# Patient Record
Sex: Female | Born: 1995 | Race: White | Hispanic: Yes | Marital: Married | Smoking: Never smoker
Health system: Southern US, Community
[De-identification: ages and names within clinical notes are randomized; demographics above are authoritative.]

## PROBLEM LIST (undated history)

## (undated) ENCOUNTER — Emergency Department (HOSPITAL_COMMUNITY): Admission: EM | Discharge status: Against Medical Advice | Payer: Self-pay

## (undated) DIAGNOSIS — N83202 Unspecified ovarian cyst, left side: Secondary | ICD-10-CM

## (undated) DIAGNOSIS — N83201 Unspecified ovarian cyst, right side: Secondary | ICD-10-CM

## (undated) DIAGNOSIS — R51 Headache: Secondary | ICD-10-CM

## (undated) DIAGNOSIS — R519 Headache, unspecified: Secondary | ICD-10-CM

## (undated) DIAGNOSIS — F909 Attention-deficit hyperactivity disorder, unspecified type: Secondary | ICD-10-CM

---
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
## 2014-04-19 ENCOUNTER — Emergency Department (INDEPENDENT_AMBULATORY_CARE_PROVIDER_SITE_OTHER): Payer: PRIVATE HEALTH INSURANCE

## 2014-04-19 ENCOUNTER — Encounter: Payer: Self-pay | Admitting: Emergency Medicine

## 2014-04-19 ENCOUNTER — Emergency Department
Admission: EM | Admit: 2014-04-19 | Discharge: 2014-04-19 | Disposition: A | Discharge status: Home / Self Care | Payer: PRIVATE HEALTH INSURANCE | Source: Home / Self Care | Attending: Emergency Medicine | Admitting: Emergency Medicine

## 2014-04-19 DIAGNOSIS — S9031XA Contusion of right foot, initial encounter: Secondary | ICD-10-CM

## 2014-04-19 DIAGNOSIS — M79609 Pain in unspecified limb: Secondary | ICD-10-CM

## 2014-04-19 DIAGNOSIS — S93429A Sprain of deltoid ligament of unspecified ankle, initial encounter: Secondary | ICD-10-CM

## 2014-04-19 DIAGNOSIS — S93629A Sprain of tarsometatarsal ligament of unspecified foot, initial encounter: Secondary | ICD-10-CM

## 2014-04-19 DIAGNOSIS — S9030XA Contusion of unspecified foot, initial encounter: Secondary | ICD-10-CM

## 2014-04-19 DIAGNOSIS — S93421A Sprain of deltoid ligament of right ankle, initial encounter: Secondary | ICD-10-CM

## 2014-04-19 DIAGNOSIS — M25579 Pain in unspecified ankle and joints of unspecified foot: Secondary | ICD-10-CM

## 2014-04-19 MED ORDER — IBUPROFEN 200 MG PO TABS: ORAL_TABLET | ORAL | Status: DC

## 2014-04-19 MED ORDER — HYDROCODONE-ACETAMINOPHEN 5-325 MG PO TABS: 1.0000 | ORAL_TABLET | ORAL | Status: DC | PRN

## 2014-04-19 NOTE — ED Provider Notes (Signed)
CSN: [id number]     Arrival date & time 04/19/14  1326 History   First MD Initiated Contact with Patient 04/19/14 1357     Chief Complaint  Patient presents with  . Foot Injury   Injured R foot/ankle jumping over a fence last evening. Pain on dorsal lateral side of foot.   Patient is a 18 y.o. female presenting with foot injury. The history is provided by the patient and a parent.  Foot Injury Lower extremity pain location: R lateral foot and ankle. Time since incident:  1 day Pain details:    Quality:  Sharp   Radiates to:  Does not radiate   Pain severity now: 7/10.   Onset quality:  Sudden Prior injury to area:  No Exacerbated by: Weightbearing. She cannot weight-bear on right foot and ankle. Ineffective treatments:  Rest Associated symptoms: decreased ROM, stiffness and swelling   Associated symptoms: no back pain, no fever, no muscle weakness, no neck pain, no numbness and no tingling     Past Medical History  Diagnosis Date  . ADHD (attention deficit hyperactivity disorder)    History reviewed. No pertinent past surgical history. Family History  Problem Relation Age of Onset  . Hypertension Mother   . Thyroid disease Mother   . Hyperlipidemia Father    History  Substance Use Topics  . Smoking status: Light Tobacco Smoker    Types: Cigarettes  . Smokeless tobacco: Never Used  . Alcohol Use: Yes     Comment: Occasionally   OB History   Grav Para Term Preterm Abortions TAB SAB Ect Mult Living                 Review of Systems  Constitutional: Negative for fever.  Musculoskeletal: Positive for stiffness. Negative for back pain and neck pain.  All other systems reviewed and are negative.   Allergies  Review of patient's allergies indicates no known allergies.  Home Medications   Prior to Admission medications   Medication Sig Start Date End Date Taking? Authorizing Provider  HYDROcodone-acetaminophen (NORCO/VICODIN) 5-325 MG per tablet Take 1-2 tablets by  mouth every 4 (four) hours as needed for severe pain. Take with food. 04/19/14   Lajean Manes, MD  ibuprofen (ADVIL,MOTRIN) 200 MG tablet Take three tablets ( 600 milligrams total) every 6 with food as needed for pain. 04/19/14   Lajean Manes, MD   BP 115/77  Pulse 84  Temp(Src) 98.1 F (36.7 C) (Oral)  Ht 5\' 5"  (1.651 m)  Wt 131 lb 8 oz (59.648 kg)  BMI 21.88 kg/m2  SpO2 100%  LMP 04/17/2014 Physical Exam  Nursing note and vitals reviewed. Constitutional: She is oriented to person, place, and time. She appears well-developed and well-nourished. No distress.  Pleasant female here with mother. No acute distress, but she is uncomfortable from right foot and ankle pain. When she tries to weight-bear, that exacerbate severe pain.  HENT:  Head: Normocephalic and atraumatic.  Eyes: Conjunctivae and EOM are normal. Pupils are equal, round, and reactive to light. No scleral icterus.  Neck: Normal range of motion.  Cardiovascular: Normal rate.   Pulmonary/Chest: Effort normal.  Abdominal: She exhibits no distension.  Musculoskeletal:       Right ankle: She exhibits decreased range of motion, swelling and ecchymosis. She exhibits no laceration and normal pulse. Tenderness. Lateral malleolus and AITFL tenderness found. No posterior TFL, no head of 5th metatarsal and no proximal fibula tenderness found. Achilles tendon normal.  Right foot: She exhibits decreased range of motion, bony tenderness and swelling. She exhibits normal capillary refill and no laceration.       Feet:  Neurological: She is alert and oriented to person, place, and time.  Skin: Skin is warm.  Psychiatric: She has a normal mood and affect.    ED Course  Procedures (including critical care time) Labs Review Labs Reviewed - No data to display  Imaging Review Dg Ankle Complete Right  04/19/2014   CLINICAL DATA:  Lateral lankle pain stab status post jumping injury.  EXAM: RIGHT ANKLE - COMPLETE 3+ VIEW  COMPARISON:   Right foot series of today's date.  FINDINGS: There is no evidence of fracture, dislocation, or joint effusion. The ankle joint mortise is preserved. There is no evidence of arthropathy or other focal bone abnormality. Soft tissues are unremarkable.  IMPRESSION: There is no acute bony abnormality of the right ankle   Electronically Signed   By: David  [place]   On: 04/19/2014 14:45   Dg Foot Complete Right  04/19/2014   CLINICAL DATA:  Lateral right foot pain and metatarsal region after jumping last evening  EXAM: RIGHT FOOT COMPLETE - 3+ VIEW  COMPARISON:  Right ankle series of today's date  FINDINGS: The bones of the foot are adequately mineralized. There is no acute fracture nor dislocation. Specific attention to the fourth and fifth metatarsals reveals no acute abnormality. The overlying soft tissues are normal.  IMPRESSION: There is no acute bony abnormality of the right foot.   Electronically Signed   By: David  [place]   On: 04/19/2014 14:41     MDM   1. Sprain of ankle, deltoid, right   2. Contusion of right foot   3. Sprain of tarsometatarsal ligament of foot    Reviewed above x-rays of right foot and right ankle: No acute bony abnormality. No fracture or dislocation. Discussed with patient and mother. Encourage rest, ice, compression with ACE bandage, and elevation of injured body part. Ace bandage applied right foot and ankle. Cam walker right foot applied. She declined crutches at this time For mild to moderate pain, ibuprofen OTC 600 mg 3 times a day with food as needed for pain. Also prescribed #12 of Vicodin 5/500. One or 2 every 4-6 hours as needed for severe pain, but precautions discussed. Note provided excusing from dancing or sports or weightbearing at work for one week. Followup with her orthopedist in one week, sooner if worse or new symptoms or red flags.  Precautions discussed. Red flags discussed. Questions invited and answered. Patient and mother voiced  understanding and agreement.     Lajean Manesavid Massey, MD 04/19/14 1517

## 2014-04-19 NOTE — ED Notes (Signed)
Injured R foot jumping over a fence last evening. Pain on dorsal lateral side of foot.

## 2014-07-06 ENCOUNTER — Encounter (HOSPITAL_COMMUNITY): Payer: Self-pay | Admitting: Emergency Medicine

## 2014-07-06 ENCOUNTER — Emergency Department (HOSPITAL_COMMUNITY)
Admission: EM | Admit: 2014-07-06 | Discharge: 2014-07-07 | Disposition: A | Discharge status: Home / Self Care | Payer: BC Managed Care – PPO | Attending: Emergency Medicine | Admitting: Emergency Medicine

## 2014-07-06 DIAGNOSIS — S4980XA Other specified injuries of shoulder and upper arm, unspecified arm, initial encounter: Secondary | ICD-10-CM | POA: Insufficient documentation

## 2014-07-06 DIAGNOSIS — S79929A Unspecified injury of unspecified thigh, initial encounter: Secondary | ICD-10-CM

## 2014-07-06 DIAGNOSIS — Z79899 Other long term (current) drug therapy: Secondary | ICD-10-CM | POA: Diagnosis not present

## 2014-07-06 DIAGNOSIS — Y9389 Activity, other specified: Secondary | ICD-10-CM | POA: Diagnosis not present

## 2014-07-06 DIAGNOSIS — S060X9A Concussion with loss of consciousness of unspecified duration, initial encounter: Secondary | ICD-10-CM | POA: Diagnosis not present

## 2014-07-06 DIAGNOSIS — F909 Attention-deficit hyperactivity disorder, unspecified type: Secondary | ICD-10-CM | POA: Insufficient documentation

## 2014-07-06 DIAGNOSIS — Z3202 Encounter for pregnancy test, result negative: Secondary | ICD-10-CM | POA: Insufficient documentation

## 2014-07-06 DIAGNOSIS — S79919A Unspecified injury of unspecified hip, initial encounter: Secondary | ICD-10-CM | POA: Diagnosis not present

## 2014-07-06 DIAGNOSIS — S298XXA Other specified injuries of thorax, initial encounter: Secondary | ICD-10-CM | POA: Diagnosis not present

## 2014-07-06 DIAGNOSIS — Y92410 Unspecified street and highway as the place of occurrence of the external cause: Secondary | ICD-10-CM | POA: Diagnosis not present

## 2014-07-06 DIAGNOSIS — S46909A Unspecified injury of unspecified muscle, fascia and tendon at shoulder and upper arm level, unspecified arm, initial encounter: Secondary | ICD-10-CM | POA: Diagnosis not present

## 2014-07-06 DIAGNOSIS — IMO0002 Reserved for concepts with insufficient information to code with codable children: Secondary | ICD-10-CM | POA: Diagnosis present

## 2014-07-06 DIAGNOSIS — F17200 Nicotine dependence, unspecified, uncomplicated: Secondary | ICD-10-CM | POA: Diagnosis not present

## 2014-07-06 MED ORDER — MORPHINE SULFATE 2 MG/ML IJ SOLN
2.0000 mg | Freq: Once | INTRAMUSCULAR | Status: AC
  Administered 2014-07-06: 2 mg via INTRAVENOUS
  Filled 2014-07-06: qty 1

## 2014-07-06 MED ORDER — ONDANSETRON HCL 4 MG/2ML IJ SOLN
4.0000 mg | Freq: Once | INTRAMUSCULAR | Status: AC
  Administered 2014-07-06: 4 mg via INTRAVENOUS
  Filled 2014-07-06: qty 2
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
## 2014-07-07 ENCOUNTER — Emergency Department (HOSPITAL_COMMUNITY): Payer: BC Managed Care – PPO

## 2014-07-07 DIAGNOSIS — IMO0002 Reserved for concepts with insufficient information to code with codable children: Secondary | ICD-10-CM | POA: Diagnosis not present

## 2014-07-07 LAB — POC URINE PREG, ED: PREG TEST UR: NEGATIVE

## 2014-07-07 MED ORDER — MORPHINE SULFATE 2 MG/ML IJ SOLN
2.0000 mg | Freq: Once | INTRAMUSCULAR | Status: AC
  Administered 2014-07-07: 2 mg via INTRAVENOUS
  Filled 2014-07-07: qty 1

## 2014-07-07 MED ORDER — IBUPROFEN 800 MG PO TABS
800.0000 mg | ORAL_TABLET | Freq: Once | ORAL | Status: AC
  Administered 2014-07-07: 800 mg via ORAL
  Filled 2014-07-07: qty 1

## 2014-07-07 MED ORDER — HYDROCODONE-ACETAMINOPHEN 5-325 MG PO TABS
1.0000 | ORAL_TABLET | Freq: Once | ORAL | Status: AC
  Administered 2014-07-07: 1 via ORAL
  Filled 2014-07-07: qty 1

## 2014-07-07 MED ORDER — ONDANSETRON HCL 4 MG/2ML IJ SOLN
4.0000 mg | Freq: Once | INTRAMUSCULAR | Status: AC
  Administered 2014-07-07: 4 mg via INTRAVENOUS
  Filled 2014-07-07: qty 2

## 2014-07-07 NOTE — ED Notes (Signed)
Pt states she is unable to urinate. Nurse will be informed.
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
## 2014-07-07 NOTE — ED Notes (Signed)
Patient reminded a urine sample is needed.  

## 2014-07-07 NOTE — ED Notes (Signed)
Notified CT of neg preg test.
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
## 2015-02-21 ENCOUNTER — Encounter: Payer: Self-pay | Admitting: Family Medicine

## 2015-02-21 ENCOUNTER — Ambulatory Visit (INDEPENDENT_AMBULATORY_CARE_PROVIDER_SITE_OTHER): Payer: PRIVATE HEALTH INSURANCE | Admitting: Family Medicine

## 2015-02-21 ENCOUNTER — Encounter (INDEPENDENT_AMBULATORY_CARE_PROVIDER_SITE_OTHER): Payer: Self-pay

## 2015-02-21 VITALS — BP 126/69 | HR 87 | Ht 65.0 in | Wt 155.0 lb

## 2015-02-21 DIAGNOSIS — M545 Low back pain, unspecified: Secondary | ICD-10-CM | POA: Insufficient documentation

## 2015-02-21 DIAGNOSIS — S39012A Strain of muscle, fascia and tendon of lower back, initial encounter: Secondary | ICD-10-CM

## 2015-02-21 MED ORDER — MELOXICAM 15 MG PO TABS: 15.0000 mg | ORAL_TABLET | Freq: Every day | ORAL | Status: DC

## 2015-02-21 NOTE — Patient Instructions (Addendum)
Your history and exam are consistent with a lumbar strain (less likely a disc herniation). Take meloxicam 15mg  daily with food for pain and inflammation. Heat 15 minutes at a time 3-4 times a day for spasms, pain. Start physical therapy. Do home exercises on days you don't go to therapy. Follow up with me in 5-6 weeks.

## 2015-02-21 NOTE — Assessment & Plan Note (Signed)
consistent with lumbar strain.  Radiographs including obliques negative.  Will go ahead with meloxicam, heat, physical therapy and home exercises.  If not improving over next 6 weeks consider MRI.
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
## 2015-03-05 ENCOUNTER — Encounter: Payer: Self-pay | Admitting: Physical Therapy

## 2015-03-05 ENCOUNTER — Ambulatory Visit: Payer: BLUE CROSS/BLUE SHIELD | Attending: Family Medicine | Admitting: Physical Therapy

## 2015-03-05 DIAGNOSIS — M545 Low back pain, unspecified: Secondary | ICD-10-CM
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
## 2015-03-12 ENCOUNTER — Ambulatory Visit: Payer: BLUE CROSS/BLUE SHIELD | Attending: Family Medicine | Admitting: Physical Therapy

## 2015-03-12 DIAGNOSIS — M545 Low back pain, unspecified: Secondary | ICD-10-CM

## 2015-03-12 NOTE — Therapy (Signed)
[facility] [address]  [address] [place], [place], [address] Phone: [phone]   Fax:  [phone]  Physical Therapy Treatment  Patient Details  Name: Kara Cole MRN: [id number] Date of Birth: 1996/10/01 Referring Provider:  Lenda KelpHudnall, Shane R, MD  Encounter Date: 03/12/2015      PT End of Session - 03/12/15 1110    Visit Number 2   Number of Visits 12   Date for PT Re-Evaluation 04/16/15   PT Start Time 1107   PT Stop Time 1155   PT Time Calculation (min) 48 min      Past Medical History  Diagnosis Date  . ADHD (attention deficit hyperactivity disorder)     No past surgical history on file.  There were no vitals filed for this visit.  Visit Diagnosis:  Left-sided low back pain without sciatica      Subjective Assessment - 03/12/15 1108    Subjective States worked a lot last week requiring great deal of standing and noted increased pain from this. Did not work yesterday so pain down to 2-3/10 today.    Currently in Pain? Yes   Pain Score 3    Pain Location Back   Pain Orientation Left;Lower       TODAY'S TREATMENT: TherEx - NuStep lvl 5, 3' Bridge 10x Partial Curl-up 15x LTR 1' Side-Lying trunk rotation stretch 20" each Leg Press 25# 2x15 Side-Lying over 55cm p-ball trunk side-bending 10x each (easy with R SB, difficult and pain with L SB) Mod Thomas Stretch with R SKTC due to R anterior torsion vs L   Manual - contract/relax L QL in R side-lying; TPR L QL in R side-lying 3 strip kinesiotape to l-spine   HEP instruction          PT Education - 03/12/15 1157    Education provided Yes   Education Details HEP Update   Person(s) Educated Patient   Methods Explanation;Demonstration;Handout   Comprehension Verbalized understanding;Returned demonstration          PT Short Term Goals - 03/12/15 1159    PT SHORT TERM GOAL #1   Title independent with initial HEP by 03/12/15   Status  Achieved           PT Long Term Goals - 03/12/15 1159    PT LONG TERM GOAL #1   Title pt denies l-spine pain greater than 2/10 by 04/16/15   Status On-going   PT LONG TERM GOAL #2   Title pt displays good squat and floor level lifting mechanics without LBP by 04/16/15   Status On-going   PT LONG TERM GOAL #3   Title pt able to perform all work duties without restriction by LBP by 04/16/15   Status On-going               Plan - 03/12/15 1159    Clinical Impression Statement pt with continued L LBP, pelvic assymetry noted with R innominant anterior/inferior vs L.  Added pelvic stability to HEP.  Pt hypersensitive throughout L pelvis so does not tolerate manual well at this time.   PT Next Visit Plan assess and treat pelvic assymetry each visit, lumbopelvic stability progressions as able, TRX next visit?   Consulted and Agree with Plan of Care Patient        Problem List Patient Active Problem List   Diagnosis Date Noted  . Low back pain 02/21/2015    Debborah Alonge PT, OCS 03/12/2015, 12:02 PM  [facility] [address]  [place], [place], [address] Phone: [phone]   Fax:  [phone]

## 2015-03-15 ENCOUNTER — Ambulatory Visit: Payer: BLUE CROSS/BLUE SHIELD | Admitting: Rehabilitation

## 2015-03-19 ENCOUNTER — Ambulatory Visit: Payer: BLUE CROSS/BLUE SHIELD | Admitting: Physical Therapy

## 2015-03-19 DIAGNOSIS — M545 Low back pain, unspecified: Secondary | ICD-10-CM

## 2015-03-19 NOTE — Therapy (Signed)
[facility] [address]  [address] [place], [place], [address] Phone: [phone]   Fax:  [phone]  Physical Therapy Treatment  Patient Details  Name: Kara Cole MRN: [id number] Date of Birth: 14-Jun-1996 Referring Provider:  Lenda KelpHudnall, Shane R, MD  Encounter Date: 03/19/2015      PT End of Session - 03/19/15 1026    Visit Number 3   Number of Visits 12   Date for PT Re-Evaluation 04/16/15   PT Start Time 1023   PT Stop Time 1108   PT Time Calculation (min) 45 min      Past Medical History  Diagnosis Date  . ADHD (attention deficit hyperactivity disorder)     No past surgical history on file.  There were no vitals filed for this visit.  Visit Diagnosis:  Left-sided low back pain without sciatica      Subjective Assessment - 03/19/15 1025    Subjective Worked 60 hours last week so fatigued and sore today.   Currently in Pain? Yes   Pain Score 5    Pain Location Back   Pain Orientation Left;Lower        TODAY'S TREATMENT Premod e-stim (10-20Hz ) with MHP to B l-spine pt prone x 15' to assist with tissue relaxation and pain reduction prior to exercise/stretching (states feels "relaxed" after this)  TherEx - Elliptical lvl 2.5, 3' LTR 1' Bridge 15x Stretch B HS, Piri, and SKTC Quadruped UE/LE 10x each Bridge 15x  3-strip kinesiotape to l-spine      PT Short Term Goals - 03/12/15 1159    PT SHORT TERM GOAL #1   Title independent with initial HEP by 03/12/15   Status Achieved           PT Long Term Goals - 03/12/15 1159    PT LONG TERM GOAL #1   Title pt denies l-spine pain greater than 2/10 by 04/16/15   Status On-going   PT LONG TERM GOAL #2   Title pt displays good squat and floor level lifting mechanics without LBP by 04/16/15   Status On-going   PT LONG TERM GOAL #3   Title pt able to perform all work duties without restriction by LBP by 04/16/15   Status On-going                Plan - 03/19/15 1100    Clinical Impression Statement focus on decreasing pain/guarding today since she is working so many hours lately.  Benefit with e-stim/MHP today.  Less TTP to L pelvis today.  Pelvis appears level.   PT Next Visit Plan assess and treat pelvic assymetry each visit, lumbopelvic stability progressions as able, TRX next visit?   Consulted and Agree with Plan of Care Patient        Problem List Patient Active Problem List   Diagnosis Date Noted  . Low back pain 02/21/2015    Bayleigh Loflin PT, OCS 03/19/2015, 11:49 AM  [facility] [address]  [address] [place], [place], [address] Phone: [phone]   Fax:  [phone]

## 2015-03-21 ENCOUNTER — Ambulatory Visit: Payer: BLUE CROSS/BLUE SHIELD | Admitting: Rehabilitation

## 2015-03-21 DIAGNOSIS — M545 Low back pain, unspecified: Secondary | ICD-10-CM

## 2015-03-21 NOTE — Therapy (Signed)
[facility] [place] [address]  [address] [place], [place], [address] Phone: [phone]   Fax:  [phone]  Physical Therapy Treatment  Patient Details  Name: Kara Cole MRN: [id number] Date of Birth: 23-Jan-1996 Referring Provider:  Lenda KelpHudnall, Shane R, MD  Encounter Date: 03/21/2015      PT End of Session - 03/21/15 1107    Visit Number 4   Number of Visits 12   Date for PT Re-Evaluation 04/16/15   PT Start Time 1107   PT Stop Time 1146   PT Time Calculation (min) 39 min      Past Medical History  Diagnosis Date  . ADHD (attention deficit hyperactivity disorder)     No past surgical history on file.  There were no vitals filed for this visit.  Visit Diagnosis:  Left-sided low back pain without sciatica      Subjective Assessment - 03/21/15 1108    Subjective Feels pretty good today but has to go into work later.    Currently in Pain? Yes   Pain Score 2    Pain Location Back   Pain Orientation Left;Lower      TODAY'S TREATMENT TherEx - Elliptical lvl 2.5, 4' Stretch B HS, Piri, and SKTC Bridge 15x LTR 1' Partial Curl-up 15x  Quadruped UE/LE 10x each Childs pose M/R/L 2x20" each Side-Lying trunk rotation stretch 2x20"  3-strip kinesiotape to l-spine         PT Short Term Goals - 03/12/15 1159    PT SHORT TERM GOAL #1   Title independent with initial HEP by 03/12/15   Status Achieved           PT Long Term Goals - 03/12/15 1159    PT LONG TERM GOAL #1   Title pt denies l-spine pain greater than 2/10 by 04/16/15   Status On-going   PT LONG TERM GOAL #2   Title pt displays good squat and floor level lifting mechanics without LBP by 04/16/15   Status On-going   PT LONG TERM GOAL #3   Title pt able to perform all work duties without restriction by LBP by 04/16/15   Status On-going               Plan - 03/21/15 1146    Clinical Impression Statement Good tolerance with exercises today,  pain noted with curl-ups and quadruped LE/UE lift but all others fine. alignments appears to be correct again today. Pt declined MH/e-stim today due to feeling better.    PT Next Visit Plan Check pelvic alignment, lumbopelivc stability and include TRX if able, modalities as needed.   Consulted and Agree with Plan of Care Patient        Problem List Patient Active Problem List   Diagnosis Date Noted  . Low back pain 02/21/2015    Ronney LionDUCKER, Elden Brucato J, PTA 03/21/2015, 11:50 AM  [facility] [place] [address]  [address] [place], [place], [address] Phone: [phone]   Fax:  [phone]

## 2015-03-26 ENCOUNTER — Ambulatory Visit: Payer: BLUE CROSS/BLUE SHIELD | Admitting: Physical Therapy

## 2015-03-28 ENCOUNTER — Ambulatory Visit: Payer: BLUE CROSS/BLUE SHIELD | Admitting: Rehabilitation

## 2015-04-02 ENCOUNTER — Ambulatory Visit: Payer: BLUE CROSS/BLUE SHIELD | Admitting: Physical Therapy

## 2015-04-02 DIAGNOSIS — M545 Low back pain, unspecified: Secondary | ICD-10-CM

## 2015-04-02 NOTE — Therapy (Signed)
[facility] [place] [address]  [address] [place], [place], [address] Phone: [phone]   Fax:  [phone]  Physical Therapy Treatment  Patient Details  Name: Kara Cole MRN: [id number] Date of Birth: 1996-01-04 Referring Provider:  Lenda KelpHudnall, Shane R, MD  Encounter Date: 04/02/2015      PT End of Session - 04/02/15 1026    Visit Number 5   Number of Visits 12   Date for PT Re-Evaluation 04/16/15   PT Start Time 1021   PT Stop Time 1104   PT Time Calculation (min) 43 min      Past Medical History  Diagnosis Date  . ADHD (attention deficit hyperactivity disorder)     No past surgical history on file.  There were no vitals filed for this visit.  Visit Diagnosis:  Left-sided low back pain without sciatica      Subjective Assessment - 04/02/15 1022    Subjective States has been working great deal lately (60 hrs/wk) and has not been doing HEP much.  States notes LBP up to 7-8/10 while at work and 5-6/10 on average.  Denies N/T.  Reports noting few minutes of sharp pain in L Lower back with FW or OH reaching motions at work.  Pt states that overall she feels as though she is improving.   Currently in Pain? No/denies   Pain Score 3   pain up to 7/10 at worst while at work   Pain Location Back   Pain Orientation Left;Lower            [facility] - 04/02/15 0001    Strength   Right/Left Hip Right;Left   Right Hip Extension 5/5   Left Hip Extension 4+/5  L LBP   Left Hip ABduction 4+/5  L LBP   Palpation   Palpation comment continued TTP L lower lumbar paraspinals and quadratus lumborum   Prone Knee Bend Test   Findings Negative   Straight Leg Raise   Findings Negative   other   Findings Positive   Comments L SI Compression in R Side-Lying   Luisa HartPatrick (FABER) Test   Findings Positive   Side Left   Comments L LBP   Thomas Test    Findings Positive   Side Left   Comments L LBP   Special Tests   Hip Special Tests  Luisa HartPatrick (FABER) Test;Thomas Test          TODAY'S TREATMENT TherEx - Elliptical lvl 2.5, 3' Bridge 15x SKTC Mod Thomas Stretch 2x20" each Quadruped UE/LE 10x then 10x each with Blue TB with R LE and L UE but unable to perform on L LE due to pain Quadruped Dirty Dog 10x each (mild L pain with R raise) 3-way Prayer stretch 2x20" each TRX BW Lunges 3 strips kinesiotape to l-spine        PT Short Term Goals - 03/12/15 1159    PT SHORT TERM GOAL #1   Title independent with initial HEP by 03/12/15   Status Achieved           PT Long Term Goals - 03/12/15 1159    PT LONG TERM GOAL #1   Title pt denies l-spine pain greater than 2/10 by 04/16/15   Status On-going   PT LONG TERM GOAL #2   Title pt displays good squat and floor level lifting mechanics without LBP by 04/16/15   Status On-going   PT LONG TERM GOAL #3   Title  pt able to perform all work duties without restriction by LBP by 04/16/15   Status On-going               Plan - 04/02/15 1050    Clinical Impression Statement s/s seem to indicate L SI vs L lower lumbar pain related to limited stability.  Pt with limited performance of HEP lately due to working so much.  Advised her to try to increase performance and added quadruped UE/LE.   PT Next Visit Plan Check pelvic alignment, lumbopelivc stability and include TRX if able, modalities as needed.   Consulted and Agree with Plan of Care Patient        Problem List Patient Active Problem List   Diagnosis Date Noted  . Low back pain 02/21/2015    Daizy Outen PT, OCS 04/02/2015, 11:11 AM  [facility] [address]  [address] [place], [place], [address] Phone: [phone]   Fax:  [phone]

## 2015-04-04 ENCOUNTER — Ambulatory Visit: Payer: BLUE CROSS/BLUE SHIELD | Admitting: Rehabilitation

## 2015-04-09 ENCOUNTER — Ambulatory Visit: Payer: BLUE CROSS/BLUE SHIELD | Admitting: Rehabilitation

## 2015-04-09 DIAGNOSIS — M545 Low back pain, unspecified: Secondary | ICD-10-CM

## 2015-04-09 NOTE — Therapy (Addendum)
[facility] [place] [address]  [place], [place], [address] Phone: [phone]   Fax:  [phone]  Physical Therapy Treatment  Patient Details  Name: [place] MRN: [id number] Date of Birth: April 16, 1996 Referring Provider:  Dene Gentry, MD  Encounter Date: 04/09/2015      PT End of Session - 04/09/15 1109    Visit Number 6   Number of Visits 12   Date for PT Re-Evaluation 04/16/15   PT Start Time 1108   PT Stop Time 1148   PT Time Calculation (min) 40 min      Past Medical History  Diagnosis Date  . ADHD (attention deficit hyperactivity disorder)     No past surgical history on file.  There were no vitals filed for this visit.  Visit Diagnosis:  Left-sided low back pain without sciatica      Subjective Assessment - 04/09/15 1109    Subjective Reports this past week has been bad for back pain. Unsure why and states that its a lot worse than normal. States her back pain has been averaging a 5-6/10 while at work but reaches a 8/10 at the end of the night. Reports she fell at work a few days ago which could have contributed it. Also reports compliance with HEP.    Currently in Pain? Yes   Pain Score 5    Pain Location Back   Pain Orientation --  Reports whole back hurts but Lt is worse.       TODAY'S TREATMENT TherEx - Elliptical lvl 1.0 x2' (pain) Stretch Bil Hamstring, Piriformis, SKTC 2x20" bilateral Bridge 15x Childs pose M/R/L 2x20" Premod e-stim (10-_0 ) with MHP to Bil L-spine x15' with pt prone to help decrease pain and relax tissues.  3-strip Kinesiotape to L-spine         PT Short Term Goals - 03/12/15 1159    PT SHORT TERM GOAL #1   Title independent with initial HEP by 03/12/15   Status Achieved           PT Long Term Goals - 03/12/15 1159    PT LONG TERM GOAL #1   Title pt denies l-spine pain greater than 2/10 by 04/16/15   Status On-going   PT LONG TERM GOAL #2   Title pt displays good squat and floor level lifting mechanics without LBP by 04/16/15   Status On-going   PT LONG TERM GOAL #3   Title pt able to perform all work duties without restriction by LBP by 04/16/15   Status On-going               Plan - 04/09/15 1132    Clinical Impression Statement Lower intensity treatment today due to high pain levels and pt unable to tolerate much. Pt had a very stiff posture during transfers and gait. Will return to exercise as able.    PT Next Visit Plan Check pelvic alignment, lumbopelivc stability and include TRX if able, modalities as needed.   Consulted and Agree with Plan of Care Patient        Problem List Patient Active Problem List   Diagnosis Date Noted  . Low back pain 02/21/2015    Barbette Hair, PTA 04/09/2015, 11:52 AM  [facility] [address]  [place], [place], [address] Phone: [phone]   Fax:  [phone]     PHYSICAL THERAPY DISCHARGE SUMMARY  Visits from Start of  Care: 6  Current functional level related to goals / functional outcomes: See above; pt did not return to PT therefore unknown if goals met.  Pt with inconsistent attendance to PT and did not show for last appt or opt to reschedule.   Remaining deficits: unknown   Education / Equipment: HEP  Plan: Patient agrees to discharge.  Patient goals were not met. Patient is being discharged due to not returning since the last visit.  ?????         Laureen Abrahams, PT, DPT 05/23/2015 10:02 AM  [facility] [address] [place], [place] [address]  [phone] (office) [phone] (fax)

## 2015-04-11 ENCOUNTER — Ambulatory Visit: Payer: BLUE CROSS/BLUE SHIELD | Attending: Family Medicine | Admitting: Rehabilitation

## 2015-05-10 ENCOUNTER — Other Ambulatory Visit: Payer: Self-pay | Admitting: *Deleted

## 2015-05-10 ENCOUNTER — Other Ambulatory Visit: Payer: Self-pay | Admitting: Family Medicine

## 2015-05-10 MED ORDER — MELOXICAM 15 MG PO TABS: 15.0000 mg | ORAL_TABLET | Freq: Every day | ORAL | Status: DC

## 2016-06-27 ENCOUNTER — Emergency Department (HOSPITAL_COMMUNITY): Payer: BLUE CROSS/BLUE SHIELD

## 2016-06-27 ENCOUNTER — Emergency Department (HOSPITAL_COMMUNITY)
Admission: EM | Admit: 2016-06-27 | Discharge: 2016-06-27 | Disposition: A | Discharge status: Home / Self Care | Payer: BLUE CROSS/BLUE SHIELD | Attending: Emergency Medicine | Admitting: Emergency Medicine

## 2016-06-27 ENCOUNTER — Encounter (HOSPITAL_COMMUNITY): Payer: Self-pay | Admitting: *Deleted

## 2016-06-27 DIAGNOSIS — R109 Unspecified abdominal pain: Secondary | ICD-10-CM | POA: Diagnosis not present

## 2016-06-27 DIAGNOSIS — R079 Chest pain, unspecified: Secondary | ICD-10-CM | POA: Diagnosis not present

## 2016-06-27 DIAGNOSIS — Y999 Unspecified external cause status: Secondary | ICD-10-CM | POA: Diagnosis not present

## 2016-06-27 DIAGNOSIS — S0990XA Unspecified injury of head, initial encounter: Secondary | ICD-10-CM | POA: Insufficient documentation

## 2016-06-27 DIAGNOSIS — S161XXA Strain of muscle, fascia and tendon at neck level, initial encounter: Secondary | ICD-10-CM | POA: Diagnosis not present

## 2016-06-27 DIAGNOSIS — S20219A Contusion of unspecified front wall of thorax, initial encounter: Secondary | ICD-10-CM | POA: Insufficient documentation

## 2016-06-27 DIAGNOSIS — Z79899 Other long term (current) drug therapy: Secondary | ICD-10-CM | POA: Insufficient documentation

## 2016-06-27 DIAGNOSIS — S301XXA Contusion of abdominal wall, initial encounter: Secondary | ICD-10-CM

## 2016-06-27 DIAGNOSIS — Y92410 Unspecified street and highway as the place of occurrence of the external cause: Secondary | ICD-10-CM | POA: Insufficient documentation

## 2016-06-27 DIAGNOSIS — S199XXA Unspecified injury of neck, initial encounter: Secondary | ICD-10-CM | POA: Diagnosis present

## 2016-06-27 DIAGNOSIS — S20212A Contusion of left front wall of thorax, initial encounter: Secondary | ICD-10-CM

## 2016-06-27 DIAGNOSIS — S8002XA Contusion of left knee, initial encounter: Secondary | ICD-10-CM | POA: Diagnosis not present

## 2016-06-27 DIAGNOSIS — Y939 Activity, unspecified: Secondary | ICD-10-CM | POA: Insufficient documentation

## 2016-06-27 LAB — URINALYSIS, ROUTINE W REFLEX MICROSCOPIC
BILIRUBIN URINE: NEGATIVE
GLUCOSE, UA: NEGATIVE mg/dL
KETONES UR: NEGATIVE mg/dL
Nitrite: NEGATIVE
PROTEIN: NEGATIVE mg/dL
Specific Gravity, Urine: 1.009 (ref 1.005–1.030)
pH: 6.5 (ref 5.0–8.0)

## 2016-06-27 LAB — RAPID URINE DRUG SCREEN, HOSP PERFORMED
Amphetamines: NOT DETECTED
BARBITURATES: NOT DETECTED
Benzodiazepines: NOT DETECTED
Cocaine: NOT DETECTED
Opiates: NOT DETECTED
TETRAHYDROCANNABINOL: NOT DETECTED

## 2016-06-27 LAB — URINE MICROSCOPIC-ADD ON

## 2016-06-27 LAB — CBC WITH DIFFERENTIAL/PLATELET
Basophils Absolute: 0 10*3/uL (ref 0.0–0.1)
Basophils Relative: 0 %
EOS PCT: 0 %
Eosinophils Absolute: 0 10*3/uL (ref 0.0–0.7)
HCT: 39.7 % (ref 36.0–46.0)
Hemoglobin: 12.7 g/dL (ref 12.0–15.0)
LYMPHS ABS: 3 10*3/uL (ref 0.7–4.0)
LYMPHS PCT: 31 %
MCH: 29.5 pg (ref 26.0–34.0)
MCHC: 32 g/dL (ref 30.0–36.0)
MCV: 92.3 fL (ref 78.0–100.0)
MONO ABS: 0.7 10*3/uL (ref 0.1–1.0)
Monocytes Relative: 7 %
Neutro Abs: 5.8 10*3/uL (ref 1.7–7.7)
Neutrophils Relative %: 62 %
PLATELETS: 214 10*3/uL (ref 150–400)
RBC: 4.3 MIL/uL (ref 3.87–5.11)
RDW: 13.6 % (ref 11.5–15.5)
WBC: 9.6 10*3/uL (ref 4.0–10.5)

## 2016-06-27 LAB — BASIC METABOLIC PANEL
Anion gap: 11 (ref 5–15)
BUN: 12 mg/dL (ref 6–20)
CHLORIDE: 107 mmol/L (ref 101–111)
CO2: 21 mmol/L — ABNORMAL LOW (ref 22–32)
CREATININE: 0.64 mg/dL (ref 0.44–1.00)
Calcium: 10.1 mg/dL (ref 8.9–10.3)
Glucose, Bld: 102 mg/dL — ABNORMAL HIGH (ref 65–99)
POTASSIUM: 3.5 mmol/L (ref 3.5–5.1)
SODIUM: 139 mmol/L (ref 135–145)

## 2016-06-27 LAB — HCG, SERUM, QUALITATIVE: Preg, Serum: NEGATIVE

## 2016-06-27 LAB — ETHANOL: Alcohol, Ethyl (B): 151 mg/dL — ABNORMAL HIGH (ref <5)

## 2016-06-27 MED ORDER — MORPHINE SULFATE (PF) 2 MG/ML IV SOLN
2.0000 mg | Freq: Once | INTRAVENOUS | Status: DC
  Filled 2016-06-27: qty 1

## 2016-06-27 MED ORDER — IOPAMIDOL (ISOVUE-300) INJECTION 61%
INTRAVENOUS | Status: AC
  Administered 2016-06-27: 100 mL
  Filled 2016-06-27: qty 100

## 2016-06-27 MED ORDER — IBUPROFEN 600 MG PO TABS: 600.0000 mg | ORAL_TABLET | Freq: Four times a day (QID) | ORAL | 0 refills | Status: DC | PRN

## 2016-06-27 MED ORDER — ONDANSETRON HCL 4 MG/2ML IJ SOLN
4.0000 mg | Freq: Once | INTRAMUSCULAR | Status: DC
  Filled 2016-06-27: qty 2

## 2016-06-27 MED ORDER — HYDROCODONE-ACETAMINOPHEN 5-325 MG PO TABS: 1.0000 | ORAL_TABLET | ORAL | 0 refills | Status: DC | PRN

## 2016-06-27 NOTE — ED Notes (Signed)
C-collar removed per Dr. Haviland.  

## 2016-06-27 NOTE — ED Notes (Signed)
Patient constantly attempting to take off the c collar  Instructed to leave it on.

## 2016-06-27 NOTE — ED Notes (Signed)
Back from CT and xray

## 2016-06-27 NOTE — ED Notes (Signed)
Patient was given discharge instructions and prescriptions.  Sister at bedside and both voiced understanding.

## 2016-06-27 NOTE — ED Notes (Signed)
Patient refused all pain meds.  Stated she did not want anything
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
## 2016-06-27 NOTE — ED Notes (Signed)
 urine to be output not input
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
## 2016-06-27 NOTE — ED Notes (Signed)
To CT and xray

## 2016-10-14 ENCOUNTER — Encounter: Payer: Self-pay | Admitting: Physical Therapy

## 2016-11-18 DIAGNOSIS — K219 Gastro-esophageal reflux disease without esophagitis: Secondary | ICD-10-CM | POA: Insufficient documentation
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
## 2018-01-25 LAB — OB RESULTS CONSOLE ANTIBODY SCREEN: Antibody Screen: NEGATIVE

## 2018-01-25 LAB — OB RESULTS CONSOLE ABO/RH: RH TYPE: POSITIVE

## 2018-01-25 LAB — OB RESULTS CONSOLE HIV ANTIBODY (ROUTINE TESTING): HIV: NONREACTIVE

## 2018-01-25 LAB — OB RESULTS CONSOLE RUBELLA ANTIBODY, IGM: RUBELLA: IMMUNE

## 2018-01-25 LAB — OB RESULTS CONSOLE GC/CHLAMYDIA
Chlamydia: NEGATIVE
Gonorrhea: NEGATIVE

## 2018-01-25 LAB — OB RESULTS CONSOLE RPR: RPR: NONREACTIVE

## 2018-01-25 LAB — OB RESULTS CONSOLE HEPATITIS B SURFACE ANTIGEN: Hepatitis B Surface Ag: NEGATIVE

## 2018-03-04 DIAGNOSIS — O99211 Obesity complicating pregnancy, first trimester: Secondary | ICD-10-CM | POA: Insufficient documentation

## 2018-03-16 ENCOUNTER — Encounter: Payer: Self-pay | Admitting: *Deleted

## 2018-03-31 ENCOUNTER — Ambulatory Visit (INDEPENDENT_AMBULATORY_CARE_PROVIDER_SITE_OTHER): Payer: PRIVATE HEALTH INSURANCE | Admitting: Obstetrics & Gynecology

## 2018-03-31 ENCOUNTER — Encounter: Payer: Self-pay | Admitting: Obstetrics & Gynecology

## 2018-03-31 DIAGNOSIS — O99210 Obesity complicating pregnancy, unspecified trimester: Secondary | ICD-10-CM | POA: Insufficient documentation

## 2018-03-31 DIAGNOSIS — Z124 Encounter for screening for malignant neoplasm of cervix: Secondary | ICD-10-CM

## 2018-03-31 DIAGNOSIS — Z3400 Encounter for supervision of normal first pregnancy, unspecified trimester: Secondary | ICD-10-CM | POA: Insufficient documentation

## 2018-03-31 DIAGNOSIS — N898 Other specified noninflammatory disorders of vagina: Secondary | ICD-10-CM | POA: Diagnosis not present

## 2018-03-31 DIAGNOSIS — Z3402 Encounter for supervision of normal first pregnancy, second trimester: Secondary | ICD-10-CM

## 2018-03-31 DIAGNOSIS — E669 Obesity, unspecified: Secondary | ICD-10-CM

## 2018-03-31 DIAGNOSIS — O99212 Obesity complicating pregnancy, second trimester: Secondary | ICD-10-CM

## 2018-03-31 NOTE — Progress Notes (Signed)
   PRENATAL VISIT NOTE  Subjective:  Kara Cole is a 22 y.o. G1P0 at [redacted]w[redacted]d being seen today for ongoing prenatal care.  She is currently monitored for the following issues for this low-risk pregnancy and has Low back pain and Supervision of normal first pregnancy, antepartum on their problem list.  Patient reports no complaints.   . Vag. Bleeding: None.   . Denies leaking of fluid.  She is transferring here from [facility] because she is moving to [place].  The following portions of the patient's history were reviewed and updated as appropriate: allergies, current medications, past family history, past medical history, past social history, past surgical history and problem list. Problem list updated.  Objective:   Vitals:   03/31/18 1354  BP: 112/69  Pulse: 93  Weight: 191 lb (86.6 kg)    Fetal Status:           General:  Alert, oriented and cooperative. Patient is in no acute distress.  Skin: Skin is warm and dry. No rash noted.   Cardiovascular: Normal heart rate noted  Respiratory: Normal respiratory effort, no problems with respiration noted  Abdomen: Soft, gravid, appropriate for gestational age.  Pain/Pressure: Absent     Pelvic: Cervical exam deferred       Cervix appears normal, discharge c/w BV  Extremities: Normal range of motion.  Edema: Trace  Mental Status: Normal mood and affect. Normal behavior. Normal judgment and thought content.   Assessment and Plan:  Pregnancy: G1P0 at [redacted]w[redacted]d  1. Supervision of normal first pregnancy, antepartum - MSAFP today - Pap smear today - anatomy u/s ordered  Preterm labor symptoms and general obstetric precautions including but not limited to vaginal bleeding, contractions, leaking of fluid and fetal movement were reviewed in detail with the patient. Please refer to After Visit Summary for other counseling recommendations.  No follow-ups on file.  No future appointments.  Allie Bossier, MD

## 2018-04-01 LAB — ALPHA FETOPROTEIN, MATERNAL
AFP MoM: 1.41
AFP, Serum: 38.4 ng/mL
Calc'd Gestational Age: 16 weeks
Maternal Wt: 198 [lb_av]
Risk for ONTD: 1
TWINS-AFP: 1

## 2018-04-02 ENCOUNTER — Emergency Department (INDEPENDENT_AMBULATORY_CARE_PROVIDER_SITE_OTHER)
Admission: EM | Admit: 2018-04-02 | Discharge: 2018-04-02 | Disposition: A | Discharge status: Home / Self Care | Payer: PRIVATE HEALTH INSURANCE | Source: Home / Self Care | Attending: Emergency Medicine | Admitting: Emergency Medicine

## 2018-04-02 ENCOUNTER — Other Ambulatory Visit: Payer: Self-pay

## 2018-04-02 ENCOUNTER — Encounter: Payer: Self-pay | Admitting: Emergency Medicine

## 2018-04-02 DIAGNOSIS — J209 Acute bronchitis, unspecified: Secondary | ICD-10-CM | POA: Diagnosis not present

## 2018-04-02 HISTORY — DX: Unspecified ovarian cyst, right side: N83.202

## 2018-04-02 HISTORY — DX: Unspecified ovarian cyst, right side: N83.201

## 2018-04-02 LAB — POCT RAPID STREP A (OFFICE): RAPID STREP A SCREEN: NEGATIVE

## 2018-04-02 LAB — POCT INFLUENZA A/B
INFLUENZA A, POC: NEGATIVE
INFLUENZA B, POC: NEGATIVE

## 2018-04-02 MED ORDER — AZITHROMYCIN 250 MG PO TABS: ORAL_TABLET | ORAL | 0 refills | Status: DC

## 2018-04-02 MED ORDER — GUAIFENESIN-CODEINE 100-10 MG/5ML PO SOLN: ORAL | 0 refills | Status: DC

## 2018-04-02 MED ORDER — ACETAMINOPHEN 325 MG PO TABS
650.0000 mg | ORAL_TABLET | Freq: Once | ORAL | Status: AC
  Administered 2018-04-02: 650 mg via ORAL

## 2018-04-02 NOTE — ED Triage Notes (Signed)
Patient is [redacted] weeks pregnant with [facility] 09/15/18. For past 3 days she has had cough with sense of fever and headache; throat raw from cough. No OTCs today.

## 2018-04-02 NOTE — Discharge Instructions (Addendum)
Please drink good amounts of fluids. You can take Tylenol for fever. I have given you cough medication with codeine to take at night. You will be on an antibiotic Zithromax to take for your productive cough. You may take Robitussin-DM during the day.

## 2018-04-02 NOTE — ED Provider Notes (Addendum)
[facility]    CSN: [id number] Arrival date & time: 04/02/18  1333     History   Chief Complaint Chief Complaint  Patient presents with  . Cough  . Headache  Patient was in her usual state of health until Monday when she started having a scratchy sore throat.  She has not had head congestion.  She has felt warm and felt chills at times.  On Thursday night she started having a significant cough.  She has been coughing up colored green  mucus.  She has not had chest pain and has not been short of breath.  She is currently [redacted] weeks pregnant.  She had a visit to her OB/GYN on Thursday and things were going well.    HPI Kara Cole is a 22 y.o. female.    Cough  Associated symptoms: headaches and sore throat   Associated symptoms: no chest pain, no shortness of breath and no wheezing   Headache  Associated symptoms: cough and sore throat   Associated symptoms: no sinus pressure     Past Medical History:  Diagnosis Date  . ADHD (attention deficit hyperactivity disorder)   . Bilateral ovarian cysts     Patient Active Problem List   Diagnosis Date Noted  . Supervision of normal first pregnancy, antepartum 03/31/2018  . Obesity in pregnancy 03/31/2018  . Low back pain 02/21/2015    History reviewed. No pertinent surgical history.  OB History    Gravida  1   Para      Term      Preterm      AB      Living        SAB      TAB      Ectopic      Multiple      Live Births               Home Medications    Prior to Admission medications   Medication Sig Start Date End Date Taking? Authorizing Provider  Prenatal Vit-Fe Fumarate-FA (MULTIVITAMIN-PRENATAL) 27-0.8 MG TABS tablet Take 1 tablet by mouth daily at 12 noon.   Yes [provider]  azithromycin (ZITHROMAX) 250 MG tablet Take 2 tabs PO x 1 dose, then 1 tab PO QD x 4 days 04/02/18   Collene Gobble, MD  guaiFENesin-codeine 100-10 MG/5ML syrup Take 1 to 2 teaspoons at  night as needed for cough. 04/02/18   Collene Gobble, MD    Family History Family History  Problem Relation Age of Onset  . Hypertension Mother   . Thyroid disease Mother   . Depression Mother   . Hyperlipidemia Father   . ADD / ADHD Father   . Hypertension Father   . Thyroid disease Father   . Anxiety disorder Sister   . ADD / ADHD Sister   . ADD / ADHD Brother   . Depression Brother   . Stroke Maternal Grandfather   . Cancer Paternal Grandmother        brain  . Stroke Paternal Grandmother     Social History Social History   Tobacco Use  . Smoking status: Never Smoker  . Smokeless tobacco: Never Used  Substance Use Topics  . Alcohol use: Yes    Comment: Occasionally  . Drug use: No     Allergies   Patient has no known allergies.   Review of Systems Review of Systems  Constitutional: Negative.   HENT: Positive for sore  throat. Negative for sinus pressure.   Eyes: Negative.   Respiratory: Positive for cough. Negative for shortness of breath and wheezing.   Cardiovascular: Negative for chest pain.  Genitourinary:       She has had no vaginal bleeding.  She is currently [redacted] weeks pregnant.  Neurological: Positive for headaches.     Physical Exam Triage Vital Signs ED Triage Vitals  Enc Vitals Group     BP 04/02/18 1402 106/72     Pulse --      Resp 04/02/18 1402 18     Temp 04/02/18 1402 99.1 F (37.3 C)     Temp Source 04/02/18 1402 Oral     SpO2 04/02/18 1402 100 %     Weight 04/02/18 1403 191 lb (86.6 kg)     Height 04/02/18 1403  (1.651 m)     Head Circumference --      Peak Flow --      Pain Score 04/02/18 1403 4     Pain Loc --      Pain Edu? --      Excl. in GC? --    No data found.  Updated Vital Signs BP 106/72 (BP Location: Right Arm)   Pulse 100   Temp 99.1 F (37.3 C) (Oral)   Resp 18   Ht  (1.651 m)   Wt 191 lb (86.6 kg)   LMP 12/09/2017   SpO2 100%   BMI 31.78 kg/m   Visual Acuity Right Eye Distance:   Left  Eye Distance:   Bilateral Distance:    Right Eye Near:   Left Eye Near:    Bilateral Near:     Physical Exam  Constitutional: She appears well-developed and well-nourished.  HENT:  Head: Normocephalic.  Mouth/Throat: Oropharynx is clear and moist.  Eyes: Pupils are equal, round, and reactive to light.  Neck: Normal range of motion. Neck supple.  Cardiovascular: Normal rate and regular rhythm.  Pulmonary/Chest: Effort normal and breath sounds normal.  There are occasional rhonchi noted.  Breath sounds were symmetrical and no rales were appreciated.  Fetal heart tones were 148 in the right lower abdomen.   UC Treatments / Results  Labs (all labs ordered are listed, but only abnormal results are displayed) Labs Reviewed  POCT RAPID STREP A (OFFICE)  POCT INFLUENZA A/B    EKG None  Radiology No results found.  Procedures Procedures (including critical care time)  Medications Ordered in UC Medications  acetaminophen (TYLENOL) tablet 650 mg (650 mg Oral Given 04/02/18 1455)    Initial Impression / Assessment and Plan / UC Course  I have reviewed the triage vital signs and the nursing notes.  Pertinent labs & imaging results that were available during my care of the patient were reviewed by me and considered in my medical decision making (see chart for details). Case discussed with the OB/GYN on-call for Dr. Loreta Ave.  I received a call from [facility] at the #[phone].  The physician on-call stated that I Z-Pak and Robitussin with codeine would be okay with this stage of her pregnancy.  She will continue to force fluids.  Follow-up with her doctor next week if she continues to have problems.  Flu test negative strep test negative    Final Clinical Impressions(s) / UC Diagnoses   Final diagnoses:  Acute bronchitis, unspecified organism     Discharge Instructions     Please drink good amounts of fluids. You can take Tylenol for fever. I  have given you  cough medication with codeine to take at night. You will be on an antibiotic Zithromax to take for your productive cough. You may take Robitussin-DM during the day.    ED Prescriptions    Medication Sig Dispense Auth. Provider   azithromycin (ZITHROMAX) 250 MG tablet Take 2 tabs PO x 1 dose, then 1 tab PO QD x 4 days 6 tablet Collene Gobble, MD   guaiFENesin-codeine 100-10 MG/5ML syrup Take 1 to 2 teaspoons at night as needed for cough. 120 mL Collene Gobble, MD     Controlled Substance Prescriptions [place] Controlled Substance Registry consulted? Not Applicable   Collene Gobble, MD 04/02/18 1500    Collene Gobble, MD 04/02/18 1501

## 2018-04-05 LAB — CYTOLOGY - PAP
Bacterial vaginitis: POSITIVE — AB
Candida vaginitis: NEGATIVE
DIAGNOSIS: NEGATIVE

## 2018-04-06 ENCOUNTER — Telehealth: Payer: Self-pay | Admitting: *Deleted

## 2018-04-06 MED ORDER — METRONIDAZOLE 500 MG PO TABS: 500.0000 mg | ORAL_TABLET | Freq: Two times a day (BID) | ORAL | 0 refills | Status: DC

## 2018-04-06 NOTE — Telephone Encounter (Signed)
-----   Message from Allie Bossier, MD sent at 04/06/2018  8:21 AM EDT ----- Her pap showed BV. Can you please call her in some flagyl? Thanks

## 2018-04-06 NOTE — Telephone Encounter (Signed)
LM on voicemail that a RX for antibiotics was sent to [facility] for BV.

## 2018-04-12 ENCOUNTER — Encounter (HOSPITAL_COMMUNITY): Payer: Self-pay

## 2018-04-21 ENCOUNTER — Other Ambulatory Visit: Payer: Self-pay | Admitting: Obstetrics & Gynecology

## 2018-04-21 ENCOUNTER — Ambulatory Visit (HOSPITAL_COMMUNITY)
Admission: RE | Admit: 2018-04-21 | Discharge: 2018-04-21 | Disposition: A | Discharge status: Home / Self Care | Payer: PRIVATE HEALTH INSURANCE | Source: Ambulatory Visit | Attending: Obstetrics & Gynecology | Admitting: Obstetrics & Gynecology

## 2018-04-21 DIAGNOSIS — Z363 Encounter for antenatal screening for malformations: Secondary | ICD-10-CM

## 2018-04-21 DIAGNOSIS — Z3400 Encounter for supervision of normal first pregnancy, unspecified trimester: Secondary | ICD-10-CM

## 2018-04-21 DIAGNOSIS — Z3402 Encounter for supervision of normal first pregnancy, second trimester: Secondary | ICD-10-CM | POA: Insufficient documentation

## 2018-04-28 ENCOUNTER — Ambulatory Visit (INDEPENDENT_AMBULATORY_CARE_PROVIDER_SITE_OTHER): Payer: PRIVATE HEALTH INSURANCE | Admitting: Obstetrics & Gynecology

## 2018-04-28 ENCOUNTER — Encounter: Payer: Self-pay | Admitting: Obstetrics & Gynecology

## 2018-04-28 DIAGNOSIS — Z3400 Encounter for supervision of normal first pregnancy, unspecified trimester: Secondary | ICD-10-CM

## 2018-04-28 DIAGNOSIS — Z3401 Encounter for supervision of normal first pregnancy, first trimester: Secondary | ICD-10-CM

## 2018-04-28 NOTE — Progress Notes (Signed)
   PRENATAL VISIT NOTE  Subjective:  Kara Cole is a 22 y.o. G1P0 at 2252w0d being seen today for ongoing prenatal care.  She is currently monitored for the following issues for this low-risk pregnancy and has Low back pain; Supervision of normal first pregnancy, antepartum; and Obesity in pregnancy on their problem list.  Patient reports light headed today when climbing steps in apartment..  Contractions: Not present. Vag. Bleeding: None.  Movement: Absent. Denies leaking of fluid.   The following portions of the patient's history were reviewed and updated as appropriate: allergies, current medications, past family history, past medical history, past social history, past surgical history and problem list. Problem list updated.  Objective:   Vitals:   04/28/18 1621  BP: 107/66  Pulse: 93  Weight: 196 lb (88.9 kg)    Fetal Status: Fetal Heart Rate (bpm): 150   Movement: Absent     General:  Alert, oriented and cooperative. Patient is in no acute distress.  Skin: Skin is warm and dry. No rash noted.   Cardiovascular: Normal heart rate noted  Respiratory: Normal respiratory effort, no problems with respiration noted  Abdomen: Soft, gravid, appropriate for gestational age.  Pain/Pressure: Absent     Pelvic: Cervical exam deferred        Extremities: Normal range of motion.  Edema: Trace  Mental Status: Normal mood and affect. Normal behavior. Normal judgment and thought content.   Assessment and Plan:  Pregnancy: G1P0 at 3452w0d  Nml visit.  Since urine is yellow, suggest increasing water intake.  If still light headed when climbing steps after good hydration, pt to let us know.    There are no diagnoses linked to this encounter. Preterm labor symptoms and general obstetric precautions including but not limited to vaginal bleeding, contractions, leaking of fluid and fetal movement were reviewed in detail with the patient. Please refer to After Visit Summary for other  counseling recommendations.   RTC 4 weeks.  Elsie LincolnKelly Dace Denn, MD

## 2018-05-27 ENCOUNTER — Encounter: Payer: PRIVATE HEALTH INSURANCE | Admitting: Advanced Practice Midwife

## 2018-05-30 ENCOUNTER — Encounter: Payer: Self-pay | Admitting: Certified Nurse Midwife

## 2018-05-30 ENCOUNTER — Ambulatory Visit (INDEPENDENT_AMBULATORY_CARE_PROVIDER_SITE_OTHER): Payer: PRIVATE HEALTH INSURANCE | Admitting: Certified Nurse Midwife

## 2018-05-30 VITALS — BP 126/74 | HR 112 | Wt 208.0 lb

## 2018-05-30 DIAGNOSIS — O99210 Obesity complicating pregnancy, unspecified trimester: Secondary | ICD-10-CM

## 2018-05-30 DIAGNOSIS — Z3400 Encounter for supervision of normal first pregnancy, unspecified trimester: Secondary | ICD-10-CM

## 2018-05-30 NOTE — Patient Instructions (Addendum)
Second Trimester of Pregnancy The second trimester is from week 13 through week 28, month 4 through 6. This is often the time in pregnancy that you feel your best. Often times, morning sickness has lessened or quit. You may have more energy, and you may get hungry more often. Your unborn baby (fetus) is growing rapidly. At the end of the sixth month, he or she is about 9 inches long and weighs about 1 pounds. You will likely feel the baby move (quickening) between 18 and 20 weeks of pregnancy. Follow these instructions at home:  Avoid all smoking, herbs, and alcohol. Avoid drugs not approved by your doctor.  Do not use any tobacco products, including cigarettes, chewing tobacco, and electronic cigarettes. If you need help quitting, ask your doctor. You may get counseling or other support to help you quit.  Only take medicine as told by your doctor. Some medicines are safe and some are not during pregnancy.  Exercise only as told by your doctor. Stop exercising if you start having cramps.  Eat regular, healthy meals.  Wear a good support bra if your breasts are tender.  Do not use hot tubs, steam rooms, or saunas.  Wear your seat belt when driving.  Avoid raw meat, uncooked cheese, and liter boxes and soil used by cats.  Take your prenatal vitamins.  Take 1500-2000 milligrams of calcium daily starting at the 20th week of pregnancy until you deliver your baby.  Try taking medicine that helps you poop (stool softener) as needed, and if your doctor approves. Eat more fiber by eating fresh fruit, vegetables, and whole grains. Drink enough fluids to keep your pee (urine) clear or pale yellow.  Take warm water baths (sitz baths) to soothe pain or discomfort caused by hemorrhoids. Use hemorrhoid cream if your doctor approves.  If you have puffy, bulging veins (varicose veins), wear support hose. Raise (elevate) your feet for 15 minutes, 3-4 times a day. Limit salt in your diet.  Avoid heavy  lifting, wear low heals, and sit up straight.  Rest with your legs raised if you have leg cramps or low back pain.  Visit your dentist if you have not gone during your pregnancy. Use a soft toothbrush to brush your teeth. Be gentle when you floss.  You can have sex (intercourse) unless your doctor tells you not to.  Go to your doctor visits. Get help if:  You feel dizzy.  You have mild cramps or pressure in your lower belly (abdomen).  You have a nagging pain in your belly area.  You continue to feel sick to your stomach (nauseous), throw up (vomit), or have watery poop (diarrhea).  You have bad smelling fluid coming from your vagina.  You have pain with peeing (urination). Get help right away if:  You have a fever.  You are leaking fluid from your vagina.  You have spotting or bleeding from your vagina.  You have severe belly cramping or pain.  You lose or gain weight rapidly.  You have trouble catching your breath and have chest pain.  You notice sudden or extreme puffiness (swelling) of your face, hands, ankles, feet, or legs.  You have not felt the baby move in over an hour.  You have severe headaches that do not go away with medicine.  You have vision changes. This information is not intended to replace advice given to you by your health care provider. Make sure you discuss any questions you have with your health care   provider. Document Released: 01/20/2010 Document Revised: 04/02/2016 Document Reviewed: 12/27/2012 Elsevier Interactive Patient Education  2017 Else[employer] Glucose Tolerance Test During Pregnancy The glucose tolerance test (GTT) is a blood test used to determine if you have developed a type of diabetes during pregnancy (gestational diabetes). This is when your body does not properly process sugar (glucose) in the food you eat, resulting in high blood glucose levels. Typically, a GTT is done after you have had a 1-hour glucose test with results that  indicate you possibly have gestational diabetes. It may also be done if:  You have a history of giving birth to very large babies or have experienced repeated fetal loss (stillbirth).  You have signs and symptoms of diabetes, such as: ? Changes in your vision. ? Tingling or numbness in your hands or feet. ? Changes in hunger, thirst, and urination not otherwise explained by your pregnancy.  The GTT lasts about 3 hours. You will be given a sugar-water solution to drink at the beginning of the test. You will have blood drawn before you drink the solution and then again 1, 2, and 3 hours after you drink it. You will not be allowed to eat or drink anything else during the test. You must remain at the testing location to make sure that your blood is drawn on time. You should also avoid exercising during the test, because exercise can alter test results. How do I prepare for this test? Eat normally for 3 days prior to the GTT test, including having plenty of carbohydrate-rich foods. Do not eat or drink anything except water during the final 12 hours before the test. In addition, your health care provider may ask you to stop taking certain medicines before the test. What do the results mean? It is your responsibility to obtain your test results. Ask the lab or department performing the test when and how you will get your results. Contact your health care provider to discuss any questions you have about your results. Range of Normal Values Ranges for normal values may vary among different labs and hospitals. You should always check with your health care provider after having lab work or other tests done to discuss whether your values are considered within normal limits. Normal levels of blood glucose are as follows:  Fasting: less than 105 mg/dL.  1 hour after drinking the solution: less than 190 mg/dL.  2 hours after drinking the solution: less than 165 mg/dL.  3 hours after drinking the solution:  less than 145 mg/dL.  Some substances can interfere with GTT results. These may include:  Blood pressure and heart failure medicines, including beta blockers, furosemide, and thiazides.  Anti-inflammatory medicines, including aspirin.  Nicotine.  Some psychiatric medicines.  Meaning of Results Outside Normal Value Ranges GTT test results that are above normal values may indicate a number of health problems, such as:  Gestational diabetes.  Acute stress response.  Cushing syndrome.  Tumors such as pheochromocytoma or glucagonoma.  Long-term kidney problems.  Pancreatitis.  Hyperthyroidism.  Current infection.  Discuss your test results with your health care provider. He or she will use the results to make a diagnosis and determine a treatment plan that is right for you. This information is not intended to replace advice given to you by your health care provider. Make sure you discuss any questions you have with your health care provider. Document Released: 04/26/2012 Document Revised: 04/02/2016 Document Reviewed: 03/02/2014 Elsevier Interactive Patient Education  2018 Elsevier Inc.  

## 2018-06-01 NOTE — Progress Notes (Signed)
   PRENATAL VISIT NOTE  Subjective:  Kara Cole is a 22 y.o. G1P0 at 8585w6d being seen today for ongoing prenatal care.  She is currently monitored for the following issues for this low-risk pregnancy and has Low back pain; Supervision of normal first pregnancy, antepartum; and Obesity in pregnancy on their problem list.  Patient reports no complaints.  Contractions: Not present. Vag. Bleeding: None.  Movement: Present. Denies leaking of fluid.   The following portions of the patient's history were reviewed and updated as appropriate: allergies, current medications, past family history, past medical history, past social history, past surgical history and problem list. Problem list updated.  Objective:   Vitals:   05/30/18 1027  BP: 126/74  Pulse: (!) 112  Weight: 208 lb (94.3 kg)    Fetal Status: Fetal Heart Rate (bpm): 139 Fundal Height: 23 cm Movement: Present     General:  Alert, oriented and cooperative. Patient is in no acute distress.  Skin: Skin is warm and dry. No rash noted.   Cardiovascular: Normal heart rate noted  Respiratory: Normal respiratory effort, no problems with respiration noted  Abdomen: Soft, gravid, appropriate for gestational age.  Pain/Pressure: Absent     Pelvic: Cervical exam deferred        Extremities: Normal range of motion.  Edema: Trace  Mental Status: Normal mood and affect. Normal behavior. Normal judgment and thought content.   Assessment and Plan:  Pregnancy: G1P0 at 1385w6d  1. Supervision of normal first pregnancy, antepartum -Patient doing well, no complaints reports good fetal movement  -Anticipatory guidance on upcoming appointment with GTT  -Educated on GTT and need to be fasting prior to testing, patient verbalizes understanding   2. Obesity in pregnancy TWG 10lbs  -Patient concerned with amount of weight gained this pregnancy  -Discussed recommendation of weight gain during pregnancy  -Discussed increased daily activity  and adding walking daily   Preterm labor symptoms and general obstetric precautions including but not limited to vaginal bleeding, contractions, leaking of fluid and fetal movement were reviewed in detail with the patient. Please refer to After Visit Summary for other counseling recommendations.  Return in about 1 month (around 06/27/2018) for ROB/2hrGTT/3TL.  Future Appointments  Date Time Provider Department Center  06/27/2018  8:15 AM Leftwich-Kirby, Wilmer FloorLisa A, CNM [facility]    Sharyon CableVeronica C Portia Wisdom, CNM

## 2018-06-01 NOTE — Progress Notes (Signed)
duplicate

## 2018-06-27 ENCOUNTER — Ambulatory Visit (INDEPENDENT_AMBULATORY_CARE_PROVIDER_SITE_OTHER): Payer: PRIVATE HEALTH INSURANCE | Admitting: Advanced Practice Midwife

## 2018-06-27 VITALS — BP 119/72 | Wt 217.0 lb

## 2018-06-27 DIAGNOSIS — Z23 Encounter for immunization: Secondary | ICD-10-CM | POA: Diagnosis not present

## 2018-06-27 DIAGNOSIS — Z3400 Encounter for supervision of normal first pregnancy, unspecified trimester: Secondary | ICD-10-CM

## 2018-06-27 DIAGNOSIS — O26893 Other specified pregnancy related conditions, third trimester: Secondary | ICD-10-CM

## 2018-06-27 DIAGNOSIS — R102 Pelvic and perineal pain: Secondary | ICD-10-CM

## 2018-06-27 DIAGNOSIS — Z3403 Encounter for supervision of normal first pregnancy, third trimester: Secondary | ICD-10-CM

## 2018-06-27 NOTE — Progress Notes (Signed)
   PRENATAL VISIT NOTE  Subjective:  Kara Cole is a 22 y.o. G1P0 at 4546w4d being seen today for ongoing prenatal care.  She is currently monitored for the following issues for this low-risk pregnancy and has Low back pain; Supervision of normal first pregnancy, antepartum; and Obesity in pregnancy on their problem list.  Patient reports pelvic pressure and occasional cramping when at work..   .  .   . Denies leaking of fluid.   The following portions of the patient's history were reviewed and updated as appropriate: allergies, current medications, past family history, past medical history, past social history, past surgical history and problem list. Problem list updated.  Objective:   Vitals:   06/27/18 0833  Weight: 98.4 kg    Fetal Status:           General:  Alert, oriented and cooperative. Patient is in no acute distress.  Skin: Skin is warm and dry. No rash noted.   Cardiovascular: Normal heart rate noted  Respiratory: Normal respiratory effort, no problems with respiration noted  Abdomen: Soft, gravid, appropriate for gestational age.        Pelvic: Cervical exam deferred        Extremities: Normal range of motion.     Mental Status: Normal mood and affect. Normal behavior. Normal judgment and thought content.   Assessment and Plan:  Pregnancy: G1P0 at 6046w4d  1. Supervision of normal first pregnancy, antepartum --Anticipatory guidance about next visits/weeks of pregnancy given. - 2Hr GTT w/ 1 Hr Carpenter 75 g - HIV antibody (with reflex) - CBC - RPR - Tdap vaccine greater than or equal to 7yo IM  2. Pelvic pain affecting pregnancy in third trimester, antepartum --Rest/ice/heat/warm bath/Tylenol/pregnancy support belt.  Reviewed support belt options with pt at today's visit and she plans to purchase one. She works on her feet as a Leisure centre managerbartender.  Preterm labor symptoms and general obstetric precautions including but not limited to vaginal bleeding,  contractions, leaking of fluid and fetal movement were reviewed in detail with the patient. Please refer to After Visit Summary for other counseling recommendations.  Return in about 2 weeks (around 07/11/2018).  No future appointments.  Sharen CounterLisa Leftwich-Kirby, CNM

## 2018-06-27 NOTE — Patient Instructions (Addendum)
Try pregnancy or maternity support belt.  You can find these on [email or web address], [employer].com, or [email or web address].   One brand to try is Prenatal Cradle (regular size and mini)   Third Trimester of Pregnancy The third trimester is from week 28 through week 40 (months 7 through 9). The third trimester is a time when the unborn baby (fetus) is growing rapidly. At the end of the ninth month, the fetus is about 20 inches in length and weighs 6-10 pounds. Body changes during your third trimester Your body will continue to go through many changes during pregnancy. The changes vary from woman to woman. During the third trimester:  Your weight will continue to increase. You can expect to gain 25-35 pounds (11-16 kg) by the end of the pregnancy.  You may begin to get stretch marks on your hips, abdomen, and breasts.  You may urinate more often because the fetus is moving lower into your pelvis and pressing on your bladder.  You may develop or continue to have heartburn. This is caused by increased hormones that slow down muscles in the digestive tract.  You may develop or continue to have constipation because increased hormones slow digestion and cause the muscles that push waste through your intestines to relax.  You may develop hemorrhoids. These are swollen veins (varicose veins) in the rectum that can itch or be painful.  You may develop swollen, bulging veins (varicose veins) in your legs.  You may have increased body aches in the pelvis, back, or thighs. This is due to weight gain and increased hormones that are relaxing your joints.  You may have changes in your hair. These can include thickening of your hair, rapid growth, and changes in texture. Some women also have hair loss during or after pregnancy, or hair that feels dry or thin. Your hair will most likely return to normal after your baby is born.  Your breasts will continue to grow and they will continue to become tender. A yellow fluid  (colostrum) may leak from your breasts. This is the first milk you are producing for your baby.  Your belly button may stick out.  You may notice more swelling in your hands, face, or ankles.  You may have increased tingling or numbness in your hands, arms, and legs. The skin on your belly may also feel numb.  You may feel short of breath because of your expanding uterus.  You may have more problems sleeping. This can be caused by the size of your belly, increased need to urinate, and an increase in your body's metabolism.  You may notice the fetus "dropping," or moving lower in your abdomen (lightening).  You may have increased vaginal discharge.  You may notice your joints feel loose and you may have pain around your pelvic bone.  What to expect at prenatal visits You will have prenatal exams every 2 weeks until week 36. Then you will have weekly prenatal exams. During a routine prenatal visit:  You will be weighed to make sure you and the baby are growing normally.  Your blood pressure will be taken.  Your abdomen will be measured to track your baby's growth.  The fetal heartbeat will be listened to.  Any test results from the previous visit will be discussed.  You may have a cervical check near your due date to see if your cervix has softened or thinned (effaced).  You will be tested for Group B streptococcus. This happens between 35 and 37 weeks.  Your health care provider may ask you:  What your birth plan is.  How you are feeling.  If you are feeling the baby move.  If you have had any abnormal symptoms, such as leaking fluid, bleeding, severe headaches, or abdominal cramping.  If you are using any tobacco products, including cigarettes, chewing tobacco, and electronic cigarettes.  If you have any questions.  Other tests or screenings that may be performed during your third trimester include:  Blood tests that check for low iron levels (anemia).  Fetal  testing to check the health, activity level, and growth of the fetus. Testing is done if you have certain medical conditions or if there are problems during the pregnancy.  Nonstress test (NST). This test checks the health of your baby to make sure there are no signs of problems, such as the baby not getting enough oxygen. During this test, a belt is placed around your belly. The baby is made to move, and its heart rate is monitored during movement.  What is false labor? False labor is a condition in which you feel small, irregular tightenings of the muscles in the womb (contractions) that usually go away with rest, changing position, or drinking water. These are called Braxton Hicks contractions. Contractions may last for hours, days, or even weeks before true labor sets in. If contractions come at regular intervals, become more frequent, increase in intensity, or become painful, you should see your health care provider. What are the signs of labor?  Abdominal cramps.  Regular contractions that start at 10 minutes apart and become stronger and more frequent with time.  Contractions that start on the top of the uterus and spread down to the lower abdomen and back.  Increased pelvic pressure and dull back pain.  A watery or bloody mucus discharge that comes from the vagina.  Leaking of amniotic fluid. This is also known as your "water breaking." It could be a slow trickle or a gush. Let your health care provider know if it has a color or strange odor. If you have any of these signs, call your health care provider right away, even if it is before your due date. Follow these instructions at home: Medicines  Follow your health care provider's instructions regarding medicine use. Specific medicines may be either safe or unsafe to take during pregnancy.  Take a prenatal vitamin that contains at least 600 micrograms (mcg) of folic acid.  If you develop constipation, try taking a stool softener if  your health care provider approves. Eating and drinking  Eat a balanced diet that includes fresh fruits and vegetables, whole grains, good sources of protein such as meat, eggs, or tofu, and low-fat dairy. Your health care provider will help you determine the amount of weight gain that is right for you.  Avoid raw meat and uncooked cheese. These carry germs that can cause birth defects in the baby.  If you have low calcium intake from food, talk to your health care provider about whether you should take a daily calcium supplement.  Eat four or five small meals rather than three large meals a day.  Limit foods that are high in fat and processed sugars, such as fried and sweet foods.  To prevent constipation: ? Drink enough fluid to keep your urine clear or pale yellow. ? Eat foods that are high in fiber, such as fresh fruits and vegetables, whole grains, and beans. Activity  Exercise only as directed by your health care provider. Most  women can continue their usual exercise routine during pregnancy. Try to exercise for 30 minutes at least 5 days a week. Stop exercising if you experience uterine contractions.  Avoid heavy lifting.  Do not exercise in extreme heat or humidity, or at high altitudes.  Wear low-heel, comfortable shoes.  Practice good posture.  You may continue to have sex unless your health care provider tells you otherwise. Relieving pain and discomfort  Take frequent breaks and rest with your legs elevated if you have leg cramps or low back pain.  Take warm sitz baths to soothe any pain or discomfort caused by hemorrhoids. Use hemorrhoid cream if your health care provider approves.  Wear a good support bra to prevent discomfort from breast tenderness.  If you develop varicose veins: ? Wear support pantyhose or compression stockings as told by your healthcare provider. ? Elevate your feet for 15 minutes, 3-4 times a day. Prenatal care  Write down your questions.  Take them to your prenatal visits.  Keep all your prenatal visits as told by your health care provider. This is important. Safety  Wear your seat belt at all times when driving.  Make a list of emergency phone numbers, including numbers for family, friends, the hospital, and police and fire departments. General instructions  Avoid cat litter boxes and soil used by cats. These carry germs that can cause birth defects in the baby. If you have a cat, ask someone to clean the litter box for you.  Do not travel far distances unless it is absolutely necessary and only with the approval of your health care provider.  Do not use hot tubs, steam rooms, or saunas.  Do not drink alcohol.  Do not use any products that contain nicotine or tobacco, such as cigarettes and e-cigarettes. If you need help quitting, ask your health care provider.  Do not use any medicinal herbs or unprescribed drugs. These chemicals affect the formation and growth of the baby.  Do not douche or use tampons or scented sanitary pads.  Do not cross your legs for long periods of time.  To prepare for the arrival of your baby: ? Take prenatal classes to understand, practice, and ask questions about labor and delivery. ? Make a trial run to the hospital. ? Visit the hospital and tour the maternity area. ? Arrange for maternity or paternity leave through employers. ? Arrange for family and friends to take care of pets while you are in the hospital. ? Purchase a rear-facing car seat and make sure you know how to install it in your car. ? Pack your hospital bag. ? Prepare the baby's nursery. Make sure to remove all pillows and stuffed animals from the baby's crib to prevent suffocation.  Visit your dentist if you have not gone during your pregnancy. Use a soft toothbrush to brush your teeth and be gentle when you floss. Contact a health care provider if:  You are unsure if you are in labor or if your water has  broken.  You become dizzy.  You have mild pelvic cramps, pelvic pressure, or nagging pain in your abdominal area.  You have lower back pain.  You have persistent nausea, vomiting, or diarrhea.  You have an unusual or bad smelling vaginal discharge.  You have pain when you urinate. Get help right away if:  Your water breaks before 37 weeks.  You have regular contractions less than 5 minutes apart before 37 weeks.  You have a fever.  You are  leaking fluid from your vagina.  You have spotting or bleeding from your vagina.  You have severe abdominal pain or cramping.  You have rapid weight loss or weight gain.  You have shortness of breath with chest pain.  You notice sudden or extreme swelling of your face, hands, ankles, feet, or legs.  Your baby makes fewer than 10 movements in 2 hours.  You have severe headaches that do not go away when you take medicine.  You have vision changes. Summary  The third trimester is from week 28 through week 40, months 7 through 9. The third trimester is a time when the unborn baby (fetus) is growing rapidly.  During the third trimester, your discomfort may increase as you and your baby continue to gain weight. You may have abdominal, leg, and back pain, sleeping problems, and an increased need to urinate.  During the third trimester your breasts will keep growing and they will continue to become tender. A yellow fluid (colostrum) may leak from your breasts. This is the first milk you are producing for your baby.  False labor is a condition in which you feel small, irregular tightenings of the muscles in the womb (contractions) that eventually go away. These are called Braxton Hicks contractions. Contractions may last for hours, days, or even weeks before true labor sets in.  Signs of labor can include: abdominal cramps; regular contractions that start at 10 minutes apart and become stronger and more frequent with time; watery or bloody  mucus discharge that comes from the vagina; increased pelvic pressure and dull back pain; and leaking of amniotic fluid. This information is not intended to replace advice given to you by your health care provider. Make sure you discuss any questions you have with your health care provider. Document Released: 10/20/2001 Document Revised: 04/02/2016 Document Reviewed: 12/27/2012 Elsevier Interactive Patient Education  2017 [employer].

## 2018-06-28 LAB — CBC
HEMATOCRIT: 31.3 % — AB (ref 35.0–45.0)
HEMOGLOBIN: 10.3 g/dL — AB (ref 11.7–15.5)
MCH: 29.9 pg (ref 27.0–33.0)
MCHC: 32.9 g/dL (ref 32.0–36.0)
MCV: 90.7 fL (ref 80.0–100.0)
MPV: 11.5 fL (ref 7.5–12.5)
Platelets: 144 10*3/uL (ref 140–400)
RBC: 3.45 10*6/uL — ABNORMAL LOW (ref 3.80–5.10)
RDW: 12 % (ref 11.0–15.0)
WBC: 8.7 10*3/uL (ref 3.8–10.8)

## 2018-06-28 LAB — 2HR GTT W 1 HR, CARPENTER, 75 G
GLUCOSE, 2 HR, GEST: 107 mg/dL (ref 65–152)
Glucose, 1 Hr, Gest: 133 mg/dL (ref 65–179)
Glucose, Fasting, Gest: 81 mg/dL (ref 65–91)

## 2018-06-28 LAB — HIV ANTIBODY (ROUTINE TESTING W REFLEX): HIV: NONREACTIVE

## 2018-06-28 LAB — RPR: RPR Ser Ql: NONREACTIVE

## 2018-07-12 ENCOUNTER — Ambulatory Visit (INDEPENDENT_AMBULATORY_CARE_PROVIDER_SITE_OTHER): Payer: PRIVATE HEALTH INSURANCE | Admitting: Obstetrics & Gynecology

## 2018-07-12 DIAGNOSIS — Z3400 Encounter for supervision of normal first pregnancy, unspecified trimester: Secondary | ICD-10-CM

## 2018-07-12 NOTE — Progress Notes (Signed)
PT recently found out about a mold issue in their house and wants to talk about the risks of that     PRENATAL VISIT NOTE  Subjective:  Kara Cole is a 22 y.o. G1P0 at [redacted]w[redacted]d being seen today for ongoing prenatal care.  She is currently monitored for the following issues for this low-risk pregnancy and has Low back pain; Supervision of normal first pregnancy, antepartum; and Obesity in pregnancy on their problem list.  Patient reports complaint of exposed to mold in apartment..  Contractions: Not present. Vag. Bleeding: None.  Movement: Present. Denies leaking of fluid.   The following portions of the patient's history were reviewed and updated as appropriate: allergies, current medications, past family history, past medical history, past social history, past surgical history and problem list. Problem list updated.  Objective:   Vitals:   07/12/18 1408  BP: 118/74  Pulse: (!) 102  Weight: 220 lb (99.8 kg)    Fetal Status: Fetal Heart Rate (bpm): 134   Movement: Present     General:  Alert, oriented and cooperative. Patient is in no acute distress.  Skin: Skin is warm and dry. No rash noted.   Cardiovascular: Normal heart rate noted  Respiratory: Normal respiratory effort, no problems with respiration noted  Abdomen: Soft, gravid, appropriate for gestational age.  Pain/Pressure: Absent     Pelvic: Cervical exam deferred        Extremities: Normal range of motion.  Edema: Trace  Mental Status: Normal mood and affect. Normal behavior. Normal judgment and thought content.   Assessment and Plan:  Pregnancy: G1P0 at [redacted]w[redacted]d  1. Supervision of normal first pregnancy, antepartum; exposure to mold -emailed dr. Ninetta Lights with ID;   Will get his recommendations.  No symptoms currently.  The workers did not use any type of sport containment device when removing the drywall and cabinetry.  Preterm labor symptoms and general obstetric precautions including but not limited to vaginal  bleeding, contractions, leaking of fluid and fetal movement were reviewed in detail with the patient. Please refer to After Visit Summary for other counseling recommendations.  Return in about 2 weeks (around 07/26/2018).  Future Appointments  Date Time Provider Department Center  07/25/2018  2:45 PM Allie Bossier, MD [facility]    Elsie Lincoln, MD

## 2018-07-13 ENCOUNTER — Telehealth: Payer: Self-pay | Admitting: *Deleted

## 2018-07-13 NOTE — Telephone Encounter (Signed)
Pt notified that no testing was necessary due to the mold she was exposed to.  Dr Penne Lash consulted with ID MD who she discussed the case with.  Pt made aware.

## 2018-07-25 ENCOUNTER — Ambulatory Visit (INDEPENDENT_AMBULATORY_CARE_PROVIDER_SITE_OTHER): Payer: PRIVATE HEALTH INSURANCE | Admitting: Obstetrics & Gynecology

## 2018-07-25 VITALS — BP 121/75 | HR 107 | Wt 221.0 lb

## 2018-07-25 DIAGNOSIS — Z3400 Encounter for supervision of normal first pregnancy, unspecified trimester: Secondary | ICD-10-CM

## 2018-07-25 NOTE — Progress Notes (Signed)
   PRENATAL VISIT NOTE  Subjective:  Kara Cole is a 22 y.o. G1P0 at 3448w4d being seen today for ongoing prenatal care.  She is currently monitored for the following issues for this low-risk pregnancy and has Low back pain; Supervision of normal first pregnancy, antepartum; and Obesity in pregnancy on their problem list.  Patient reports no complaints.  Contractions: Not present. Vag. Bleeding: None.  Movement: Present. Denies leaking of fluid.   The following portions of the patient's history were reviewed and updated as appropriate: allergies, current medications, past family history, past medical history, past social history, past surgical history and problem list. Problem list updated.  Objective:   Vitals:   07/25/18 1443  BP: 121/75  Pulse: (!) 107  Weight: 221 lb (100.2 kg)    Fetal Status: Fetal Heart Rate (bpm): 133   Movement: Present     General:  Alert, oriented and cooperative. Patient is in no acute distress.  Skin: Skin is warm and dry. No rash noted.   Cardiovascular: Normal heart rate noted  Respiratory: Normal respiratory effort, no problems with respiration noted  Abdomen: Soft, gravid, appropriate for gestational age.  Pain/Pressure: Absent     Pelvic: Cervical exam deferred        Extremities: Normal range of motion.  Edema: Trace  Mental Status: Normal mood and affect. Normal behavior. Normal judgment and thought content.   Assessment and Plan:  Pregnancy: G1P0 at 2948w4d  There are no diagnoses linked to this encounter. Preterm labor symptoms and general obstetric precautions including but not limited to vaginal bleeding, contractions, leaking of fluid and fetal movement were reviewed in detail with the patient. Please refer to After Visit Summary for other counseling recommendations.  No follow-ups on file.  No future appointments.  Allie BossierMyra C Erol Flanagin, MD

## 2018-07-28 ENCOUNTER — Encounter: Payer: Self-pay | Admitting: *Deleted

## 2018-08-09 ENCOUNTER — Encounter: Payer: Self-pay | Admitting: Certified Nurse Midwife

## 2018-08-09 ENCOUNTER — Ambulatory Visit (INDEPENDENT_AMBULATORY_CARE_PROVIDER_SITE_OTHER): Payer: PRIVATE HEALTH INSURANCE | Admitting: Certified Nurse Midwife

## 2018-08-09 VITALS — BP 123/74 | HR 89 | Wt 224.0 lb

## 2018-08-09 DIAGNOSIS — Z3400 Encounter for supervision of normal first pregnancy, unspecified trimester: Secondary | ICD-10-CM

## 2018-08-09 DIAGNOSIS — O99213 Obesity complicating pregnancy, third trimester: Secondary | ICD-10-CM

## 2018-08-09 DIAGNOSIS — O99210 Obesity complicating pregnancy, unspecified trimester: Secondary | ICD-10-CM

## 2018-08-09 DIAGNOSIS — G43819 Other migraine, intractable, without status migrainosus: Secondary | ICD-10-CM

## 2018-08-09 DIAGNOSIS — Z3403 Encounter for supervision of normal first pregnancy, third trimester: Secondary | ICD-10-CM

## 2018-08-09 MED ORDER — BUTALBITAL-APAP-CAFFEINE 50-325-40 MG PO CAPS: 1.0000 | ORAL_CAPSULE | Freq: Four times a day (QID) | ORAL | 1 refills | Status: DC | PRN

## 2018-08-09 NOTE — Progress Notes (Signed)
   PRENATAL VISIT NOTE  Subjective:  Kara Cole is a 22 y.o. G1P0 at [redacted]w[redacted]d being seen today for ongoing prenatal care.  She is currently monitored for the following issues for this low-risk pregnancy and has Low back pain; Supervision of normal first pregnancy, antepartum; and Obesity in pregnancy on their problem list.  Patient reports headache.  Contractions: Not present. Vag. Bleeding: None.  Movement: Present. Denies leaking of fluid.   The following portions of the patient's history were reviewed and updated as appropriate: allergies, current medications, past family history, past medical history, past social history, past surgical history and problem list. Problem list updated.  Objective:   Vitals:   08/09/18 1102  BP: 123/74  Pulse: 89  Weight: 224 lb (101.6 kg)    Fetal Status: Fetal Heart Rate (bpm): 132 Fundal Height: 35 cm Movement: Present     General:  Alert, oriented and cooperative. Patient is in no acute distress.  Skin: Skin is warm and dry. No rash noted.   Cardiovascular: Normal heart rate noted  Respiratory: Normal respiratory effort, no problems with respiration noted  Abdomen: Soft, gravid, appropriate for gestational age.  Pain/Pressure: Present     Pelvic: Cervical exam deferred        Extremities: Normal range of motion.  Edema: Trace  Mental Status: Normal mood and affect. Normal behavior. Normal judgment and thought content.   Assessment and Plan:  Pregnancy: G1P0 at [redacted]w[redacted]d  1. Supervision of normal first pregnancy, antepartum -Patient doing well, complains of occasional headaches that are associated with light sensitivity. -Anticipatory guidance on upcoming appointment with GBS screening and recommendation of antibiotics during labor if screening is positive  2. Obesity in pregnancy -TWG 26lbs during pregnancy   3. Other migraine without status migrainosus, intractable -Reports taking 1,000mg  of Tylenol with occasional relief of HA. Pt  reports having a hx of headache during pregnancy where she took Excedrin for relief. -Safe medications during pregnancy reviewed, Rx for Fioricet given to be used if HA is not relieved with Tylenol - No history of elevated BP and denies vision changes or abdominal pain.  - Butalbital-APAP-Caffeine 50-325-40 MG capsule; Take 1-2 capsules by mouth every 6 (six) hours as needed for headache.  Dispense: 30 capsule; Refill: 1  Preterm labor symptoms and general obstetric precautions including but not limited to vaginal bleeding, contractions, leaking of fluid and fetal movement were reviewed in detail with the patient. Please refer to After Visit Summary for other counseling recommendations.  Return in about 10 days (around 08/19/2018) for ROB.  Future Appointments  Date Time Provider Department Center  08/19/2018 11:00 AM Donette Larry, CNM [facility]    Sharyon Cable, CNM

## 2018-08-09 NOTE — Patient Instructions (Signed)

## 2018-08-11 ENCOUNTER — Telehealth: Payer: Self-pay

## 2018-08-11 DIAGNOSIS — G43819 Other migraine, intractable, without status migrainosus: Secondary | ICD-10-CM

## 2018-08-11 MED ORDER — BUTALBITAL-APAP-CAFFEINE 50-325-40 MG PO CAPS: 1.0000 | ORAL_CAPSULE | Freq: Four times a day (QID) | ORAL | 1 refills | Status: DC | PRN

## 2018-08-11 NOTE — Telephone Encounter (Signed)
I tried to send pt's Rx for Butalbital- APAP- Caffeine to her pharmacy but, it got printed on accident. New Rx sent electronically

## 2018-08-11 NOTE — Telephone Encounter (Signed)
PT called stating that her prescription for Butalbital-APAP-Caffeine 50-325-40 MG capsule was sent to the wrong pharmacy. I have fixed prescription.

## 2018-08-19 ENCOUNTER — Ambulatory Visit (INDEPENDENT_AMBULATORY_CARE_PROVIDER_SITE_OTHER): Payer: PRIVATE HEALTH INSURANCE | Admitting: Certified Nurse Midwife

## 2018-08-19 ENCOUNTER — Other Ambulatory Visit (HOSPITAL_COMMUNITY)
Admission: RE | Admit: 2018-08-19 | Discharge: 2018-08-19 | Disposition: A | Discharge status: Home / Self Care | Payer: PRIVATE HEALTH INSURANCE | Source: Ambulatory Visit | Attending: Certified Nurse Midwife | Admitting: Certified Nurse Midwife

## 2018-08-19 VITALS — BP 127/74 | HR 98 | Wt 227.0 lb

## 2018-08-19 DIAGNOSIS — Z3400 Encounter for supervision of normal first pregnancy, unspecified trimester: Secondary | ICD-10-CM

## 2018-08-19 DIAGNOSIS — Z3403 Encounter for supervision of normal first pregnancy, third trimester: Secondary | ICD-10-CM | POA: Diagnosis present

## 2018-08-19 LAB — OB RESULTS CONSOLE GBS: GBS: POSITIVE

## 2018-08-19 NOTE — Progress Notes (Signed)
Subjective:  Kara Cole is a 22 y.o. G1P0 at [redacted]w[redacted]d being seen today for ongoing prenatal care.  She is currently monitored for the following issues for this low-risk pregnancy and has Low back pain; Supervision of normal first pregnancy, antepartum; and Obesity in pregnancy on their problem list.  Patient reports no complaints.  Contractions: Not present. Vag. Bleeding: None.  Movement: Present. Denies leaking of fluid.   The following portions of the patient's history were reviewed and updated as appropriate: allergies, current medications, past family history, past medical history, past social history, past surgical history and problem list. Problem list updated.  Objective:   Vitals:   08/19/18 1101  BP: 127/74  Pulse: 98  Weight: 103 kg    Fetal Status: Fetal Heart Rate (bpm): 138   Movement: Present     General:  Alert, oriented and cooperative. Patient is in no acute distress.  Skin: Skin is warm and dry. No rash noted.   Cardiovascular: Normal heart rate noted  Respiratory: Normal respiratory effort, no problems with respiration noted  Abdomen: Soft, gravid, appropriate for gestational age. Pain/Pressure: Absent     Pelvic: Vag. Bleeding: None Vag D/C Character: Thin   Cervical exam deferred        Extremities: Normal range of motion.  Edema: Trace  Mental Status: Normal mood and affect. Normal behavior. Normal judgment and thought content.   Urinalysis:      Assessment and Plan:  Pregnancy: G1P0 at [redacted]w[redacted]d  1. Encounter for supervision of normal first pregnancy in third trimester  - Culture, beta strep (group b only) - Cervicovaginal ancillary only   Preterm labor symptoms and general obstetric precautions including but not limited to vaginal bleeding, contractions, leaking of fluid and fetal movement were reviewed in detail with the patient. Please refer to After Visit Summary for other counseling recommendations.  Return in about 1 week (around  08/26/2018).   Donette Larry, CNM

## 2018-08-22 LAB — CULTURE, BETA STREP (GROUP B ONLY)
MICRO NUMBER: 91225569
SPECIMEN QUALITY: ADEQUATE

## 2018-08-23 ENCOUNTER — Encounter: Payer: Self-pay | Admitting: Certified Nurse Midwife

## 2018-08-23 DIAGNOSIS — O99820 Streptococcus B carrier state complicating pregnancy: Secondary | ICD-10-CM | POA: Insufficient documentation

## 2018-08-23 LAB — CERVICOVAGINAL ANCILLARY ONLY
CHLAMYDIA, DNA PROBE: NEGATIVE
NEISSERIA GONORRHEA: NEGATIVE

## 2018-08-26 ENCOUNTER — Ambulatory Visit (INDEPENDENT_AMBULATORY_CARE_PROVIDER_SITE_OTHER): Payer: PRIVATE HEALTH INSURANCE | Admitting: Certified Nurse Midwife

## 2018-08-26 VITALS — BP 133/79 | HR 103 | Wt 229.0 lb

## 2018-08-26 DIAGNOSIS — O99820 Streptococcus B carrier state complicating pregnancy: Secondary | ICD-10-CM

## 2018-08-26 DIAGNOSIS — Z3400 Encounter for supervision of normal first pregnancy, unspecified trimester: Secondary | ICD-10-CM

## 2018-08-26 NOTE — Progress Notes (Addendum)
First BP 137/80. Repeat BP 139/88. Pt denies headaches or visual changes. Next repeat BP 133/79

## 2018-08-26 NOTE — Progress Notes (Signed)
Subjective:  Kara Cole is a 22 y.o. G1P0 at [redacted]w[redacted]d being seen today for ongoing prenatal care.  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  Patient reports pelvic pressure.  Contractions: Irritability. Vag. Bleeding: None.  Movement: Present. Denies leaking of fluid.   The following portions of the patient's history were reviewed and updated as appropriate: allergies, current medications, past family history, past medical history, past social history, past surgical history and problem list. Problem list updated.  Objective:   Vitals:   08/26/18 1107 08/26/18 1112  BP: 137/80 139/88  Pulse: (!) 103   Weight: 103.9 kg     Fetal Status: Fetal Heart Rate (bpm): 140 Fundal Height: 37 cm Movement: Present  Presentation: Vertex  General:  Alert, oriented and cooperative. Patient is in no acute distress.  Skin: Skin is warm and dry. No rash noted.   Cardiovascular: Normal heart rate noted  Respiratory: Normal respiratory effort, no problems with respiration noted  Abdomen: Soft, gravid, appropriate for gestational age. Pain/Pressure: Present     Pelvic: Vag. Bleeding: None Vag D/C Character: Thin   Cervical exam performed Dilation: Closed Effacement (%): Thick Station: -3  Extremities: Normal range of motion.  Edema: Trace  Mental Status: Normal mood and affect. Normal behavior. Normal judgment and thought content.   Urinalysis:      Assessment and Plan:  Pregnancy: G1P0 at [redacted]w[redacted]d  1. 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 Supervision of normal first pregnancy, antepartum - maternity support belt  Term labor symptoms and general obstetric precautions including but not limited to vaginal bleeding,  contractions, leaking of fluid and fetal movement were reviewed in detail with the patient. Please refer to After Visit Summary for other counseling recommendations.  Return in about 1 week (around 09/02/2018).   Donette Larry, CNM

## 2018-09-02 ENCOUNTER — Ambulatory Visit (INDEPENDENT_AMBULATORY_CARE_PROVIDER_SITE_OTHER): Payer: PRIVATE HEALTH INSURANCE | Admitting: Certified Nurse Midwife

## 2018-09-02 VITALS — BP 120/73 | HR 90 | Wt 231.0 lb

## 2018-09-02 DIAGNOSIS — O99820 Streptococcus B carrier state complicating pregnancy: Secondary | ICD-10-CM

## 2018-09-02 DIAGNOSIS — Z3400 Encounter for supervision of normal first pregnancy, unspecified trimester: Secondary | ICD-10-CM

## 2018-09-02 DIAGNOSIS — Z3403 Encounter for supervision of normal first pregnancy, third trimester: Secondary | ICD-10-CM

## 2018-09-02 NOTE — Progress Notes (Signed)
Subjective:  Kara Cole is a 22 y.o. G1P0 at [redacted]w[redacted]d being seen today for ongoing prenatal care.  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  Patient reports had ctx q20 min the other day, thought it was labor starting, then they stopped.  Contractions: Irregular. Vag. Bleeding: None.  Movement: Present. Denies leaking of fluid.   The following portions of the patient's history were reviewed and updated as appropriate: allergies, current medications, past family history, past medical history, past social history, past surgical history and problem list. Problem list updated.  Objective:   Vitals:   09/02/18 1059  BP: 120/73  Pulse: 90  Weight: 104.8 kg    Fetal Status: Fetal Heart Rate (bpm): 128 Fundal Height: 38 cm Movement: Present  Presentation: Vertex  General:  Alert, oriented and cooperative. Patient is in no acute distress.  Skin: Skin is warm and dry. No rash noted.   Cardiovascular: Normal heart rate noted  Respiratory: Normal respiratory effort, no problems with respiration noted  Abdomen: Soft, gravid, appropriate for gestational age. Pain/Pressure: Present     Pelvic: Vag. Bleeding: None Vag D/C Character: Thin   Cervical exam performed Dilation: Closed Effacement (%): 50 Station: -3  Extremities: Normal range of motion.  Edema: Mild pitting, slight indentation  Mental Status: Normal mood and affect. Normal behavior. Normal judgment and thought content.   Urinalysis:      Assessment and Plan:  Pregnancy: G1P0 at [redacted]w[redacted]d  1. Supervision of normal first pregnancy, antepartum  2. 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 Please refer to After Visit Summary for other counseling recommendations.  Return in about 1 week (around 09/09/2018).   Donette Larry, CNM

## 2018-09-02 NOTE — Patient Instructions (Signed)
Braxton Hicks Contractions °Contractions of the uterus can occur throughout pregnancy, but they are not always a sign that you are in labor. You may have practice contractions called Braxton Hicks contractions. These false labor contractions are sometimes confused with true labor. °What are Braxton Hicks contractions? °Braxton Hicks contractions are tightening movements that occur in the muscles of the uterus before labor. Unlike true labor contractions, these contractions do not result in opening (dilation) and thinning of the cervix. Toward the end of pregnancy (32-34 weeks), Braxton Hicks contractions can happen more often and may become stronger. These contractions are sometimes difficult to tell apart from true labor because they can be very uncomfortable. You should not feel embarrassed if you go to the hospital with false labor. °Sometimes, the only way to tell if you are in true labor is for your health care provider to look for changes in the cervix. The health care provider will do a physical exam and may monitor your contractions. If you are not in true labor, the exam should show that your cervix is not dilating and your water has not broken. °If there are other health problems associated with your pregnancy, it is completely safe for you to be sent home with false labor. You may continue to have Braxton Hicks contractions until you go into true labor. °How to tell the difference between true labor and false labor °True labor °· Contractions last 30-70 seconds. °· Contractions become very regular. °· Discomfort is usually felt in the top of the uterus, and it spreads to the lower abdomen and low back. °· Contractions do not go away with walking. °· Contractions usually become more intense and increase in frequency. °· The cervix dilates and gets thinner. °False labor °· Contractions are usually shorter and not as strong as true labor contractions. °· Contractions are usually irregular. °· Contractions  are often felt in the front of the lower abdomen and in the groin. °· Contractions may go away when you walk around or change positions while lying down. °· Contractions get weaker and are shorter-lasting as time goes on. °· The cervix usually does not dilate or become thin. °Follow these instructions at home: °· Take over-the-counter and prescription medicines only as told by your health care provider. °· Keep up with your usual exercises and follow other instructions from your health care provider. °· Eat and drink lightly if you think you are going into labor. °· If Braxton Hicks contractions are making you uncomfortable: °? Change your position from lying down or resting to walking, or change from walking to resting. °? Sit and rest in a tub of warm water. °? Drink enough fluid to keep your urine pale yellow. Dehydration may cause these contractions. °? Do slow and deep breathing several times an hour. °· Keep all follow-up prenatal visits as told by your health care provider. This is important. °Contact a health care provider if: °· You have a fever. °· You have continuous pain in your abdomen. °Get help right away if: °· Your contractions become stronger, more regular, and closer together. °· You have fluid leaking or gushing from your vagina. °· You pass blood-tinged mucus (bloody show). °· You have bleeding from your vagina. °· You have low back pain that you never had before. °· You feel your baby’s head pushing down and causing pelvic pressure. °· Your baby is not moving inside you as much as it used to. °Summary °· Contractions that occur before labor are called Braxton   Hicks contractions, false labor, or practice contractions. °· Braxton Hicks contractions are usually shorter, weaker, farther apart, and less regular than true labor contractions. True labor contractions usually become progressively stronger and regular and they become more frequent. °· Manage discomfort from Braxton Hicks contractions by  changing position, resting in a warm bath, drinking plenty of water, or practicing deep breathing. °This information is not intended to replace advice given to you by your health care provider. Make sure you discuss any questions you have with your health care provider. °Document Released: 03/11/2017 Document Revised: 03/11/2017 Document Reviewed: 03/11/2017 °Elsevier Interactive Patient Education © 2018 [employer] ° °

## 2018-09-06 ENCOUNTER — Inpatient Hospital Stay (HOSPITAL_COMMUNITY)
Admission: AD | Admit: 2018-09-06 | Discharge: 2018-09-06 | Disposition: A | Discharge status: Home / Self Care | Payer: No Typology Code available for payment source | Source: Ambulatory Visit | Attending: Family Medicine | Admitting: Family Medicine

## 2018-09-06 ENCOUNTER — Inpatient Hospital Stay (HOSPITAL_BASED_OUTPATIENT_CLINIC_OR_DEPARTMENT_OTHER): Payer: No Typology Code available for payment source

## 2018-09-06 ENCOUNTER — Encounter (HOSPITAL_COMMUNITY): Payer: Self-pay

## 2018-09-06 DIAGNOSIS — Z818 Family history of other mental and behavioral disorders: Secondary | ICD-10-CM | POA: Insufficient documentation

## 2018-09-06 DIAGNOSIS — Z3A38 38 weeks gestation of pregnancy: Secondary | ICD-10-CM

## 2018-09-06 DIAGNOSIS — O368130 Decreased fetal movements, third trimester, not applicable or unspecified: Secondary | ICD-10-CM

## 2018-09-06 DIAGNOSIS — F909 Attention-deficit hyperactivity disorder, unspecified type: Secondary | ICD-10-CM | POA: Insufficient documentation

## 2018-09-06 DIAGNOSIS — O3483 Maternal care for other abnormalities of pelvic organs, third trimester: Secondary | ICD-10-CM | POA: Insufficient documentation

## 2018-09-06 DIAGNOSIS — Z79899 Other long term (current) drug therapy: Secondary | ICD-10-CM | POA: Insufficient documentation

## 2018-09-06 DIAGNOSIS — O99343 Other mental disorders complicating pregnancy, third trimester: Secondary | ICD-10-CM | POA: Diagnosis not present

## 2018-09-06 DIAGNOSIS — Z3689 Encounter for other specified antenatal screening: Secondary | ICD-10-CM

## 2018-09-06 NOTE — MAU Note (Signed)
Pt reports she has not felt baby move much today at all. C/o some mild cramping as well.

## 2018-09-06 NOTE — MAU Provider Note (Signed)
History     CSN: [id number]  Arrival date and time: 09/06/18 1836   First Provider Initiated Contact with Patient 09/06/18 1933      Chief Complaint  Patient presents with  . Decreased Fetal Movement   HPI Kara Cole is a 22 y.o. G1P0 at [redacted]w[redacted]d who presents with decreased fetal movement. She states she has not felt any movement since last night. She reports cramping daily. Denies any leaking or bleeding.   OB History    Gravida  1   Para      Term      Preterm      AB      Living        SAB      TAB      Ectopic      Multiple      Live Births              Past Medical History:  Diagnosis Date  . ADHD (attention deficit hyperactivity disorder)   . Bilateral ovarian cysts     History reviewed. No pertinent surgical history.  Family History  Problem Relation Age of Onset  . Hypertension Mother   . Thyroid disease Mother   . Depression Mother   . Hyperlipidemia Father   . ADD / ADHD Father   . Hypertension Father   . Thyroid disease Father   . Anxiety disorder Sister   . ADD / ADHD Sister   . ADD / ADHD Brother   . Depression Brother   . Stroke Maternal Grandfather   . Cancer Paternal Grandmother        brain  . Stroke Paternal Grandmother     Social History   Tobacco Use  . Smoking status: Never Smoker  . Smokeless tobacco: Never Used  Substance Use Topics  . Alcohol use: Not Currently    Comment: Occasionally  . Drug use: No    Allergies: No Known Allergies  Medications Prior to Admission  Medication Sig Dispense Refill Last Dose  . Butalbital-APAP-Caffeine 50-325-40 MG capsule Take 1-2 capsules by mouth every 6 (six) hours as needed for headache. 30 capsule 1 Taking  . Prenatal Vit-Fe Fumarate-FA (MULTIVITAMIN-PRENATAL) 27-0.8 MG TABS tablet Take 1 tablet by mouth daily at 12 noon.   Taking    Review of Systems  Constitutional: Negative.  Negative for fatigue and fever.  HENT: Negative.   Respiratory: Negative.   Negative for shortness of breath.   Cardiovascular: Negative.  Negative for chest pain.  Gastrointestinal: Negative.  Negative for abdominal pain, constipation, diarrhea, nausea and vomiting.  Genitourinary: Negative.  Negative for dysuria, vaginal bleeding and vaginal discharge.  Neurological: Negative.  Negative for dizziness and headaches.   Physical Exam   Blood pressure 135/80, pulse 97, temperature 99.5 F (37.5 C), temperature source Oral, resp. rate 18, height 5\' 5"  (1.651 m), weight 105.7 kg, last menstrual period 12/09/2017, SpO2 99 %.  Physical Exam  Nursing note and vitals reviewed. Constitutional: She is oriented to person, place, and time. She appears well-developed and well-nourished. No distress.  HENT:  Head: Normocephalic.  Eyes: Pupils are equal, round, and reactive to light.  Cardiovascular: Normal rate, regular rhythm and normal heart sounds.  Respiratory: Effort normal and breath sounds normal. No respiratory distress.  GI: Soft. Bowel sounds are normal. She exhibits no distension. There is no tenderness.  Neurological: She is alert and oriented to person, place, and time.  Skin: Skin is warm and dry.  Psychiatric:  She has a normal mood and affect. Her behavior is normal. Judgment and thought content normal.   Fetal Tracing:  Baseline: 130 Variability: moderate Accels: 15x15 Decels: none  Toco: none  MAU Course  Procedures  MDM NST- reactive Audible fetal movement heard. Patient insisting she is not feeling any fetal movement [place] [facility]FM BPP- 8/8  Consulted with Dr. Adrian Blackwater- ok to discharge patient home  Assessment and Plan   1. Decreased fetal movements in third trimester, single or unspecified fetus   2. [redacted] weeks gestation of pregnancy   3. NST (non-stress test) reactive    -Discharge home in stable condition -Fetal kick count precautions discussed -Patient advised to follow-up with [facility] [place] on Thursday as scheduled for prenatal  care -Patient may return to MAU as needed or if her condition were to change or worsen   Rolm Bookbinder CNM 09/06/2018, 7:33 PM

## 2018-09-06 NOTE — Discharge Instructions (Signed)

## 2018-09-08 ENCOUNTER — Ambulatory Visit (INDEPENDENT_AMBULATORY_CARE_PROVIDER_SITE_OTHER): Payer: PRIVATE HEALTH INSURANCE | Admitting: Obstetrics & Gynecology

## 2018-09-08 VITALS — BP 124/71 | HR 90 | Wt 236.0 lb

## 2018-09-08 DIAGNOSIS — O99820 Streptococcus B carrier state complicating pregnancy: Secondary | ICD-10-CM

## 2018-09-08 DIAGNOSIS — Z3400 Encounter for supervision of normal first pregnancy, unspecified trimester: Secondary | ICD-10-CM

## 2018-09-08 DIAGNOSIS — O99213 Obesity complicating pregnancy, third trimester: Secondary | ICD-10-CM

## 2018-09-08 DIAGNOSIS — O99210 Obesity complicating pregnancy, unspecified trimester: Secondary | ICD-10-CM

## 2018-09-08 NOTE — Progress Notes (Signed)
   PRENATAL VISIT NOTE  Subjective:  Kara Cole is a 22 y.o. G1P0 at [redacted]w[redacted]d being seen today for ongoing prenatal care.  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  Patient reports no complaints.  Contractions: Irregular. Vag. Bleeding: None.  Movement: Present. Denies leaking of fluid.   The following portions of the patient's history were reviewed and updated as appropriate: allergies, current medications, past family history, past medical history, past social history, past surgical history and problem list. Problem list updated.  Objective:   Vitals:   09/08/18 1402  BP: 124/71  Pulse: 90  Weight: 236 lb (107 kg)    Fetal Status: Fetal Heart Rate (bpm): 145 Fundal Height: 38 cm Movement: Present  Presentation: Vertex  General:  Alert, oriented and cooperative. Patient is in no acute distress.  Skin: Skin is warm and dry. No rash noted.   Cardiovascular: Normal heart rate noted  Respiratory: Normal respiratory effort, no problems with respiration noted  Abdomen: Soft, gravid, appropriate for gestational age.  Pain/Pressure: Present     Pelvic: Cervical exam performed Dilation: 1 Effacement (%): 50 Station: Ballotable  Extremities: Normal range of motion.     Mental Status: Normal mood and affect. Normal behavior. Normal judgment and thought content.   Assessment and Plan:  Pregnancy: G1P0 at [redacted]w[redacted]d  1. 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 Obesity in pregnancy   3. Supervision of normal first pregnancy, antepartum   Term labor symptoms and general obstetric precautions including but not limited to vaginal bleeding, contractions, leaking of fluid and fetal movement were  reviewed in detail with the patient. Please refer to After Visit Summary for other counseling recommendations.  Return in about 1 week (around 09/15/2018).  No future appointments.  Allie Bossier, MD

## 2018-09-13 ENCOUNTER — Inpatient Hospital Stay (HOSPITAL_COMMUNITY)
Admission: AD | Admit: 2018-09-13 | Discharge: 2018-09-13 | Disposition: A | Discharge status: Home / Self Care | Payer: PRIVATE HEALTH INSURANCE | Source: Ambulatory Visit | Attending: Obstetrics & Gynecology | Admitting: Obstetrics & Gynecology

## 2018-09-13 ENCOUNTER — Other Ambulatory Visit: Payer: Self-pay

## 2018-09-13 ENCOUNTER — Encounter (HOSPITAL_COMMUNITY): Payer: Self-pay | Admitting: *Deleted

## 2018-09-13 ENCOUNTER — Inpatient Hospital Stay (HOSPITAL_COMMUNITY)
Admission: AD | Admit: 2018-09-13 | Discharge: 2018-09-16 | DRG: 807 | Disposition: A | Discharge status: Home / Self Care | Payer: PRIVATE HEALTH INSURANCE | Attending: Family Medicine | Admitting: Family Medicine

## 2018-09-13 DIAGNOSIS — D649 Anemia, unspecified: Secondary | ICD-10-CM | POA: Diagnosis present

## 2018-09-13 DIAGNOSIS — Z23 Encounter for immunization: Secondary | ICD-10-CM

## 2018-09-13 DIAGNOSIS — O99210 Obesity complicating pregnancy, unspecified trimester: Secondary | ICD-10-CM

## 2018-09-13 DIAGNOSIS — O99820 Streptococcus B carrier state complicating pregnancy: Secondary | ICD-10-CM

## 2018-09-13 DIAGNOSIS — Z3400 Encounter for supervision of normal first pregnancy, unspecified trimester: Secondary | ICD-10-CM

## 2018-09-13 DIAGNOSIS — O471 False labor at or after 37 completed weeks of gestation: Secondary | ICD-10-CM | POA: Insufficient documentation

## 2018-09-13 DIAGNOSIS — Z3A39 39 weeks gestation of pregnancy: Secondary | ICD-10-CM

## 2018-09-13 DIAGNOSIS — O4292 Full-term premature rupture of membranes, unspecified as to length of time between rupture and onset of labor: Secondary | ICD-10-CM | POA: Diagnosis present

## 2018-09-13 DIAGNOSIS — O99824 Streptococcus B carrier state complicating childbirth: Secondary | ICD-10-CM | POA: Diagnosis present

## 2018-09-13 DIAGNOSIS — O4202 Full-term premature rupture of membranes, onset of labor within 24 hours of rupture: Secondary | ICD-10-CM | POA: Diagnosis not present

## 2018-09-13 DIAGNOSIS — O479 False labor, unspecified: Secondary | ICD-10-CM

## 2018-09-13 DIAGNOSIS — O9902 Anemia complicating childbirth: Secondary | ICD-10-CM | POA: Diagnosis present

## 2018-09-13 DIAGNOSIS — O4290 Premature rupture of membranes, unspecified as to length of time between rupture and onset of labor, unspecified weeks of gestation: Secondary | ICD-10-CM | POA: Diagnosis present

## 2018-09-13 HISTORY — DX: Headache, unspecified: R51.9

## 2018-09-13 HISTORY — DX: Headache: R51

## 2018-09-13 LAB — CBC
HEMATOCRIT: 33.4 % — AB (ref 36.0–46.0)
Hemoglobin: 10.7 g/dL — ABNORMAL LOW (ref 12.0–15.0)
MCH: 28.9 pg (ref 26.0–34.0)
MCHC: 32 g/dL (ref 30.0–36.0)
MCV: 90.3 fL (ref 80.0–100.0)
Platelets: 157 10*3/uL (ref 150–400)
RBC: 3.7 MIL/uL — ABNORMAL LOW (ref 3.87–5.11)
RDW: 13.5 % (ref 11.5–15.5)
WBC: 9.9 10*3/uL (ref 4.0–10.5)
nRBC: 0 % (ref 0.0–0.2)

## 2018-09-13 LAB — TYPE AND SCREEN
ABO/RH(D): O POS
ANTIBODY SCREEN: NEGATIVE

## 2018-09-13 MED ORDER — OXYCODONE-ACETAMINOPHEN 5-325 MG PO TABS: 1.0000 | ORAL_TABLET | ORAL | Status: DC | PRN

## 2018-09-13 MED ORDER — PENICILLIN G 3 MILLION UNITS IVPB - SIMPLE MED
3.0000 10*6.[IU] | INTRAVENOUS | Status: DC
  Administered 2018-09-14 (×3): 3 10*6.[IU] via INTRAVENOUS
  Filled 2018-09-13 (×6): qty 100

## 2018-09-13 MED ORDER — OXYTOCIN 40 UNITS IN LACTATED RINGERS INFUSION - SIMPLE MED: 2.5000 [IU]/h | INTRAVENOUS | Status: DC

## 2018-09-13 MED ORDER — PROMETHAZINE HCL 25 MG/ML IJ SOLN
12.5000 mg | Freq: Once | INTRAMUSCULAR | Status: AC
  Administered 2018-09-13: 12.5 mg via INTRAMUSCULAR
  Filled 2018-09-13: qty 1

## 2018-09-13 MED ORDER — MORPHINE SULFATE (PF) 4 MG/ML IV SOLN
4.0000 mg | Freq: Once | INTRAVENOUS | Status: AC
  Administered 2018-09-13: 4 mg via INTRAMUSCULAR
  Filled 2018-09-13: qty 1

## 2018-09-13 MED ORDER — FENTANYL CITRATE (PF) 100 MCG/2ML IJ SOLN
100.0000 ug | INTRAMUSCULAR | Status: DC | PRN
  Administered 2018-09-13 – 2018-09-14 (×3): 100 ug via INTRAVENOUS
  Filled 2018-09-13 (×3): qty 2

## 2018-09-13 MED ORDER — FLEET ENEMA 7-19 GM/118ML RE ENEM: 1.0000 | ENEMA | RECTAL | Status: DC | PRN

## 2018-09-13 MED ORDER — LIDOCAINE HCL (PF) 1 % IJ SOLN
30.0000 mL | INTRAMUSCULAR | Status: DC | PRN
  Filled 2018-09-13: qty 30

## 2018-09-13 MED ORDER — ACETAMINOPHEN 325 MG PO TABS: 650.0000 mg | ORAL_TABLET | ORAL | Status: DC | PRN

## 2018-09-13 MED ORDER — LACTATED RINGERS IV SOLN
INTRAVENOUS | Status: DC
  Administered 2018-09-13: 22:00:00 via INTRAVENOUS

## 2018-09-13 MED ORDER — SOD CITRATE-CITRIC ACID 500-334 MG/5ML PO SOLN: 30.0000 mL | ORAL | Status: DC | PRN

## 2018-09-13 MED ORDER — SODIUM CHLORIDE 0.9 % IV SOLN
5.0000 10*6.[IU] | Freq: Once | INTRAVENOUS | Status: AC
  Administered 2018-09-13: 5 10*6.[IU] via INTRAVENOUS
  Filled 2018-09-13: qty 5

## 2018-09-13 MED ORDER — ONDANSETRON HCL 4 MG/2ML IJ SOLN
4.0000 mg | Freq: Four times a day (QID) | INTRAMUSCULAR | Status: DC | PRN
  Administered 2018-09-14: 4 mg via INTRAVENOUS
  Filled 2018-09-13: qty 2

## 2018-09-13 MED ORDER — LACTATED RINGERS IV SOLN: 500.0000 mL | INTRAVENOUS | Status: DC | PRN

## 2018-09-13 MED ORDER — OXYCODONE-ACETAMINOPHEN 5-325 MG PO TABS: 2.0000 | ORAL_TABLET | ORAL | Status: DC | PRN

## 2018-09-13 MED ORDER — OXYTOCIN BOLUS FROM INFUSION
500.0000 mL | Freq: Once | INTRAVENOUS | Status: AC
  Administered 2018-09-14: 500 mL via INTRAVENOUS

## 2018-09-13 NOTE — Discharge Instructions (Signed)
If your contractions get stronger or closer together please come back to the MAU at [facility] Contractions Contractions of the uterus can occur throughout pregnancy, but they are not always a sign that you are in labor. You may have practice contractions called Braxton Hicks contractions. These false labor contractions are sometimes confused with true labor. What are Deberah Pelton contractions? Braxton Hicks contractions are tightening movements that occur in the muscles of the uterus before labor. Unlike true labor contractions, these contractions do not result in opening (dilation) and thinning of the cervix. Toward the end of pregnancy (32-34 weeks), Braxton Hicks contractions can happen more often and may become stronger. These contractions are sometimes difficult to tell apart from true labor because they can be very uncomfortable. You should not feel embarrassed if you go to the hospital with false labor. Sometimes, the only way to tell if you are in true labor is for your health care provider to look for changes in the cervix. The health care provider will do a physical exam and may monitor your contractions. If you are not in true labor, the exam should show that your cervix is not dilating and your water has not broken. If there are other health problems associated with your pregnancy, it is completely safe for you to be sent home with false labor. You may continue to have Braxton Hicks contractions until you go into true labor. How to tell the difference between true labor and false labor True labor  Contractions last 30-70 seconds.  Contractions become very regular.  Discomfort is usually felt in the top of the uterus, and it spreads to the lower abdomen and low back.  Contractions do not go away with walking.  Contractions usually become more intense and increase in frequency.  The cervix dilates and gets thinner. False labor  Contractions are usually shorter  and not as strong as true labor contractions.  Contractions are usually irregular.  Contractions are often felt in the front of the lower abdomen and in the groin.  Contractions may go away when you walk around or change positions while lying down.  Contractions get weaker and are shorter-lasting as time goes on.  The cervix usually does not dilate or become thin. Follow these instructions at home:  Take over-the-counter and prescription medicines only as told by your health care provider.  Keep up with your usual exercises and follow other instructions from your health care provider.  Eat and drink lightly if you think you are going into labor.  If Braxton Hicks contractions are making you uncomfortable: ? Change your position from lying down or resting to walking, or change from walking to resting. ? Sit and rest in a tub of warm water. ? Drink enough fluid to keep your urine pale yellow. Dehydration may cause these contractions. ? Do slow and deep breathing several times an hour.  Keep all follow-up prenatal visits as told by your health care provider. This is important. Contact a health care provider if:  You have a fever.  You have continuous pain in your abdomen. Get help right away if:  Your contractions become stronger, more regular, and closer together.  You have fluid leaking or gushing from your vagina.  You pass blood-tinged mucus (bloody show).  You have bleeding from your vagina.  You have low back pain that you never had before.  You feel your babys head pushing down and causing pelvic pressure.  Your baby is not moving  inside you as much as it used to. Summary  Contractions that occur before labor are called Braxton Hicks contractions, false labor, or practice contractions.  Braxton Hicks contractions are usually shorter, weaker, farther apart, and less regular than true labor contractions. True labor contractions usually become progressively  stronger and regular and they become more frequent.  Manage discomfort from [facility] contractions by changing position, resting in a warm bath, drinking plenty of water, or practicing deep breathing. This information is not intended to replace advice given to you by your health care provider. Make sure you discuss any questions you have with your health care provider. Document Released: 03/11/2017 Document Revised: 03/11/2017 Document Reviewed: 03/11/2017 Elsevier Interactive Patient Education  2018 [employer].

## 2018-09-13 NOTE — H&P (Addendum)
LABOR AND DELIVERY ADMISSION HISTORY AND PHYSICAL NOTE  Kara Cole is a 22 y.o. female G1P0 with IUP at [redacted]w[redacted]d presenting for PROM.   Presented with contreactions every 3-23min, spontaneous rupture of fluid at 2115.  She reports positive fetal movement. She denies lvaginal bleeding.  Prenatal History/Complications: PNC at [facility] Pregnancy complications:  - GBS+ - PROM  Past Medical History: Past Medical History:  Diagnosis Date  . ADHD (attention deficit hyperactivity disorder)   . Bilateral ovarian cysts   . Headache     Past Surgical History: History reviewed. No pertinent surgical history.  Obstetrical History: OB History    Gravida  1   Para      Term      Preterm      AB      Living        SAB      TAB      Ectopic      Multiple      Live Births              Social History: Social History   Socioeconomic History  . Marital status: Single    Spouse name: Not on file  . Number of children: Not on file  . Years of education: Not on file  . Highest education level: Not on file  Occupational History  . Occupation: Engineering geologist Needs  . Financial resource strain: Not on file  . Food insecurity:    Worry: Not on file    Inability: Not on file  . Transportation needs:    Medical: Not on file    Non-medical: Not on file  Tobacco Use  . Smoking status: Never Smoker  . Smokeless tobacco: Never Used  Substance and Sexual Activity  . Alcohol use: Not Currently    Comment: Occasionally  . Drug use: No  . Sexual activity: Yes    Partners: Male    Birth control/protection: None  Lifestyle  . Physical activity:    Days per week: Not on file    Minutes per session: Not on file  . Stress: Not on file  Relationships  . Social connections:    Talks on phone: Not on file    Gets together: Not on file    Attends religious service: Not on file    Active member of club or organization: Not on file    Attends meetings of clubs or  organizations: Not on file    Relationship status: Not on file  Other Topics Concern  . Not on file  Social History Narrative   ** Merged History Encounter **        Family History: Family History  Problem Relation Age of Onset  . Hypertension Mother   . Thyroid disease Mother   . Depression Mother   . Hyperlipidemia Father   . ADD / ADHD Father   . Hypertension Father   . Thyroid disease Father   . Anxiety disorder Sister   . ADD / ADHD Sister   . ADD / ADHD Brother   . Depression Brother   . Stroke Maternal Grandfather   . Cancer Paternal Grandmother        brain  . Stroke Paternal Grandmother     Allergies: No Known Allergies  Medications Prior to Admission  Medication Sig Dispense Refill Last Dose  . Butalbital-APAP-Caffeine 50-325-40 MG capsule Take 1-2 capsules by mouth every 6 (six) hours as needed for headache. 30 capsule 1 Past Week  at Unknown time  . Prenatal Vit-Fe Fumarate-FA (MULTIVITAMIN-PRENATAL) 27-0.8 MG TABS tablet Take 1 tablet by mouth daily at 12 noon.   09/13/2018 at Unknown time     Review of Systems  All systems reviewed and negative except as stated in HPI  Physical Exam Blood pressure 133/78, pulse 92, temperature 98.2 F (36.8 C), temperature source Oral, resp. rate 18, last menstrual period 12/09/2017, SpO2 100 %. General appearance: alert, oriented, NAD Lungs: normal respiratory effort Heart: regular rate Abdomen: soft, non-tender; gravid, FH appropriate for GA Extremities: No calf swelling or tenderness Presentation: cephalic by sutures Fetal monitoring: Cat 1 Uterine activity: Irregular, 3-41min Dilation: 1 Effacement (%): 90 Station: -3 Exam by:: GInger Morris RN  Prenatal labs: ABO, Rh: O/Positive/-- (03/19 0000) Antibody: Negative (03/19 0000) Rubella: Immune (03/19 0000) RPR: NON-REACTIVE (08/19 0817)  HBsAg: Negative (03/19 0000)  HIV: NON-REACTIVE (08/19 0817)  GC/Chlamydia: negative GBS: Positive (10/11 0000)  2-hr  GTT: normal Genetic screening:  normal Anatomy US: normal  Prenatal Transfer Tool  Maternal Diabetes: No Genetic Screening: Normal Maternal Ultrasounds/Referrals: Normal Fetal Ultrasounds or other Referrals:  None Maternal Substance Abuse:  No Significant Maternal Medications:  None Significant Maternal Lab Results: None  Results for orders placed or performed during the hospital encounter of 09/13/18 (from the past 24 hour(s))  CBC   Collection Time: 09/13/18  9:30 PM  Result Value Ref Range   WBC 9.9 4.0 - 10.5 K/uL   RBC 3.70 (L) 3.87 - 5.11 MIL/uL   Hemoglobin 10.7 (L) 12.0 - 15.0 g/dL   HCT 16.1 (L) 09.6 - 04.5 %   MCV 90.3 80.0 - 100.0 fL   MCH 28.9 26.0 - 34.0 pg   MCHC 32.0 30.0 - 36.0 g/dL   RDW 40.9 81.1 - 91.4 %   Platelets 157 150 - 400 K/uL   nRBC 0.0 0.0 - 0.2 %    Patient Active Problem List   Diagnosis Date Noted  . Premature rupture of membranes 09/13/2018  . Group B Streptococcus carrier state affecting pregnancy 08/23/2018  . Supervision of normal first pregnancy, antepartum 03/31/2018  . Obesity in pregnancy 03/31/2018  . Low back pain 02/21/2015    Assessment: Kara Cole is a 22 y.o. G1P0 at [redacted]w[redacted]d here for PROM.   #Labor: augment with pit as needed #Pain: epidural #FWB: Cat 1 #ID:  GBS+ penicillin intrapartum #MOF: breast #MOC: undecided #Circ: yes - location undecided  PROM: Ruptured at 2100. - Limit cervical checks - If chorio-->amp and gent  Avie Arenas, PA-S 09/13/2018, 10:22 PM   RESIDENT ADDENDUM  I have separately seen and examined the patient. I have discussed the findings and exam with the medical student and agree with the above note, which I have edited appropriately. I helped develop the management plan that is described in the student's note, and I agree with the content.   Terisa Starr, MD 09/14/2018, 1:01 AM   I spoke with and examined patient and agree with resident/PA/SNM's note and plan of care.   Cheral Marker, CNM, WHNP-BC 09/14/2018 4:11 AM

## 2018-09-13 NOTE — MAU Note (Signed)
Pt here for worsening contractions every 3-5 mins. Denies LOF or vaginal bleeding. Reports good fetal movement. Was 1.5cm yesterday.

## 2018-09-13 NOTE — MAU Note (Signed)
Been cramping all day, reports u/cs q 3 min apart for the past hour and getting stronger. Denies any vag bleeding or leaking. States her baby is moving well.

## 2018-09-14 ENCOUNTER — Inpatient Hospital Stay (HOSPITAL_COMMUNITY): Payer: PRIVATE HEALTH INSURANCE | Admitting: Anesthesiology

## 2018-09-14 ENCOUNTER — Encounter (HOSPITAL_COMMUNITY): Payer: Self-pay | Admitting: *Deleted

## 2018-09-14 DIAGNOSIS — O4202 Full-term premature rupture of membranes, onset of labor within 24 hours of rupture: Secondary | ICD-10-CM

## 2018-09-14 DIAGNOSIS — O99824 Streptococcus B carrier state complicating childbirth: Secondary | ICD-10-CM

## 2018-09-14 DIAGNOSIS — Z3A39 39 weeks gestation of pregnancy: Secondary | ICD-10-CM

## 2018-09-14 LAB — CBC
HEMATOCRIT: 29.8 % — AB (ref 36.0–46.0)
HEMOGLOBIN: 9.7 g/dL — AB (ref 12.0–15.0)
MCH: 29 pg (ref 26.0–34.0)
MCHC: 32.6 g/dL (ref 30.0–36.0)
MCV: 89.2 fL (ref 80.0–100.0)
Platelets: 146 10*3/uL — ABNORMAL LOW (ref 150–400)
RBC: 3.34 MIL/uL — ABNORMAL LOW (ref 3.87–5.11)
RDW: 13.5 % (ref 11.5–15.5)
WBC: 15 10*3/uL — AB (ref 4.0–10.5)
nRBC: 0 % (ref 0.0–0.2)

## 2018-09-14 LAB — ABO/RH: ABO/RH(D): O POS

## 2018-09-14 MED ORDER — BENZOCAINE-MENTHOL 20-0.5 % EX AERO
1.0000 "application " | INHALATION_SPRAY | CUTANEOUS | Status: DC | PRN
  Administered 2018-09-14: 1 via TOPICAL
  Filled 2018-09-14: qty 56

## 2018-09-14 MED ORDER — EPHEDRINE 5 MG/ML INJ
10.0000 mg | INTRAVENOUS | Status: DC | PRN
  Filled 2018-09-14: qty 2

## 2018-09-14 MED ORDER — DIPHENHYDRAMINE HCL 50 MG/ML IJ SOLN: 12.5000 mg | INTRAMUSCULAR | Status: DC | PRN

## 2018-09-14 MED ORDER — ACETAMINOPHEN 325 MG PO TABS
650.0000 mg | ORAL_TABLET | ORAL | Status: DC | PRN
  Administered 2018-09-14: 650 mg via ORAL
  Filled 2018-09-14: qty 2

## 2018-09-14 MED ORDER — DIPHENHYDRAMINE HCL 25 MG PO CAPS: 25.0000 mg | ORAL_CAPSULE | Freq: Four times a day (QID) | ORAL | Status: DC | PRN

## 2018-09-14 MED ORDER — ZOLPIDEM TARTRATE 5 MG PO TABS: 5.0000 mg | ORAL_TABLET | Freq: Every evening | ORAL | Status: DC | PRN

## 2018-09-14 MED ORDER — ONDANSETRON HCL 4 MG PO TABS: 4.0000 mg | ORAL_TABLET | ORAL | Status: DC | PRN

## 2018-09-14 MED ORDER — PHENYLEPHRINE 40 MCG/ML (10ML) SYRINGE FOR IV PUSH (FOR BLOOD PRESSURE SUPPORT)
80.0000 ug | PREFILLED_SYRINGE | INTRAVENOUS | Status: DC | PRN
  Filled 2018-09-14: qty 10
  Filled 2018-09-14: qty 5

## 2018-09-14 MED ORDER — LACTATED RINGERS IV SOLN: INTRAVENOUS | Status: DC

## 2018-09-14 MED ORDER — SOD CITRATE-CITRIC ACID 500-334 MG/5ML PO SOLN: 30.0000 mL | ORAL | Status: DC | PRN

## 2018-09-14 MED ORDER — OXYTOCIN 40 UNITS IN LACTATED RINGERS INFUSION - SIMPLE MED
1.0000 m[IU]/min | INTRAVENOUS | Status: DC
  Administered 2018-09-14: 2 m[IU]/min via INTRAVENOUS
  Filled 2018-09-14: qty 1000

## 2018-09-14 MED ORDER — OXYTOCIN BOLUS FROM INFUSION: 500.0000 mL | Freq: Once | INTRAVENOUS | Status: DC

## 2018-09-14 MED ORDER — WITCH HAZEL-GLYCERIN EX PADS
1.0000 "application " | MEDICATED_PAD | CUTANEOUS | Status: DC | PRN
  Administered 2018-09-14: 1 via TOPICAL

## 2018-09-14 MED ORDER — LACTATED RINGERS IV SOLN
500.0000 mL | Freq: Once | INTRAVENOUS | Status: AC
  Administered 2018-09-14: 500 mL via INTRAVENOUS

## 2018-09-14 MED ORDER — DIBUCAINE 1 % RE OINT: 1.0000 "application " | TOPICAL_OINTMENT | RECTAL | Status: DC | PRN

## 2018-09-14 MED ORDER — PRENATAL MULTIVITAMIN CH
1.0000 | ORAL_TABLET | Freq: Every day | ORAL | Status: DC
  Administered 2018-09-15: 1 via ORAL
  Filled 2018-09-14: qty 1

## 2018-09-14 MED ORDER — TERBUTALINE SULFATE 1 MG/ML IJ SOLN
0.2500 mg | Freq: Once | INTRAMUSCULAR | Status: DC | PRN
  Filled 2018-09-14: qty 1

## 2018-09-14 MED ORDER — LIDOCAINE HCL (PF) 1 % IJ SOLN
30.0000 mL | INTRAMUSCULAR | Status: DC | PRN
  Filled 2018-09-14: qty 30

## 2018-09-14 MED ORDER — LIDOCAINE HCL (PF) 1 % IJ SOLN
INTRAMUSCULAR | Status: DC | PRN
  Administered 2018-09-14: 10 mL via EPIDURAL

## 2018-09-14 MED ORDER — FENTANYL 2.5 MCG/ML BUPIVACAINE 1/10 % EPIDURAL INFUSION (WH - ANES)
14.0000 mL/h | INTRAMUSCULAR | Status: DC | PRN
  Administered 2018-09-14: 14 mL/h via EPIDURAL
  Filled 2018-09-14: qty 100

## 2018-09-14 MED ORDER — OXYCODONE-ACETAMINOPHEN 5-325 MG PO TABS: 2.0000 | ORAL_TABLET | ORAL | Status: DC | PRN

## 2018-09-14 MED ORDER — LACTATED RINGERS IV SOLN: 500.0000 mL | INTRAVENOUS | Status: DC | PRN

## 2018-09-14 MED ORDER — SENNOSIDES-DOCUSATE SODIUM 8.6-50 MG PO TABS
2.0000 | ORAL_TABLET | ORAL | Status: DC
  Administered 2018-09-15 (×2): 2 via ORAL
  Filled 2018-09-14 (×2): qty 2

## 2018-09-14 MED ORDER — ACETAMINOPHEN 325 MG PO TABS: 650.0000 mg | ORAL_TABLET | ORAL | Status: DC | PRN

## 2018-09-14 MED ORDER — ONDANSETRON HCL 4 MG/2ML IJ SOLN: 4.0000 mg | INTRAMUSCULAR | Status: DC | PRN

## 2018-09-14 MED ORDER — TETANUS-DIPHTH-ACELL PERTUSSIS 5-2.5-18.5 LF-MCG/0.5 IM SUSP: 0.5000 mL | Freq: Once | INTRAMUSCULAR | Status: DC

## 2018-09-14 MED ORDER — OXYTOCIN 40 UNITS IN LACTATED RINGERS INFUSION - SIMPLE MED: 2.5000 [IU]/h | INTRAVENOUS | Status: DC

## 2018-09-14 MED ORDER — IBUPROFEN 600 MG PO TABS
600.0000 mg | ORAL_TABLET | Freq: Four times a day (QID) | ORAL | Status: DC
  Administered 2018-09-14 – 2018-09-16 (×7): 600 mg via ORAL
  Filled 2018-09-14 (×8): qty 1

## 2018-09-14 MED ORDER — OXYCODONE-ACETAMINOPHEN 5-325 MG PO TABS: 1.0000 | ORAL_TABLET | ORAL | Status: DC | PRN

## 2018-09-14 MED ORDER — ONDANSETRON HCL 4 MG/2ML IJ SOLN: 4.0000 mg | Freq: Four times a day (QID) | INTRAMUSCULAR | Status: DC | PRN

## 2018-09-14 MED ORDER — COCONUT OIL OIL: 1.0000 "application " | TOPICAL_OIL | Status: DC | PRN

## 2018-09-14 MED ORDER — PHENYLEPHRINE 40 MCG/ML (10ML) SYRINGE FOR IV PUSH (FOR BLOOD PRESSURE SUPPORT)
80.0000 ug | PREFILLED_SYRINGE | INTRAVENOUS | Status: DC | PRN
  Filled 2018-09-14: qty 5

## 2018-09-14 MED ORDER — SIMETHICONE 80 MG PO CHEW: 80.0000 mg | CHEWABLE_TABLET | ORAL | Status: DC | PRN

## 2018-09-14 NOTE — Anesthesia Procedure Notes (Signed)
Epidural Patient location during procedure: OB Start time: 09/14/2018 4:47 AM End time: 09/14/2018 5:00 AM  Staffing Anesthesiologist: Lucretia Kern, MD Performed: anesthesiologist   Preanesthetic Checklist Completed: patient identified, pre-op evaluation, timeout performed, IV checked, risks and benefits discussed and monitors and equipment checked  Epidural Patient position: sitting Prep: DuraPrep Patient monitoring: heart rate, continuous pulse ox and blood pressure Approach: midline Location: L3-L4 Injection technique: LOR saline  Needle:  Needle type: Tuohy  Needle gauge: 17 G Needle length: 9 cm Needle insertion depth: 7 cm Catheter type: closed end flexible Catheter size: 19 Gauge Catheter at skin depth: 12 cm  Assessment Events: blood not aspirated, injection not painful, no injection resistance, negative IV test and no paresthesia  Additional Notes Reason for block:procedure for pain

## 2018-09-14 NOTE — Anesthesia Postprocedure Evaluation (Signed)
Anesthesia Post Note  Patient: Kara Cole  Procedure(s) Performed: AN AD HOC LABOR EPIDURAL     Patient location during evaluation: Mother Baby Anesthesia Type: Epidural Level of consciousness: awake Pain management: satisfactory to patient Vital Signs Assessment: post-procedure vital signs reviewed and stable Respiratory status: spontaneous breathing Cardiovascular status: stable Anesthetic complications: no    Last Vitals:  Vitals:   09/14/18 1504 09/14/18 1543  BP: 131/83 119/71  Pulse: 98 83  Resp: 19 18  Temp: 36.9 C 37 C  SpO2: 100%     Last Pain:  Vitals:   09/14/18 1545  TempSrc:   PainSc: 0-No pain   Pain Goal:                 [employer]

## 2018-09-14 NOTE — Progress Notes (Signed)
Labor Progress Note Kara Cole is a 22 y.o. G1P0 at [redacted]w[redacted]d presented for SROM  S:  Comfortable with epidural. No c/o.   O:  BP 129/76   Pulse 86   Temp 98.5 F (36.9 C) (Oral)   Resp 18   Ht 5\' 5"  (1.651 m)   Wt 108 kg   LMP 12/09/2017   SpO2 97%   BMI 39.62 kg/m  EFM: baseline 115 bpm/ mod variability/ + accels/ early decels  Toco: 2-5 SVE: 6/100/-1 by RN Pitocin: 6 mu/min  A/P: 22 y.o. G1P0 [redacted]w[redacted]d  1. Labor: active 2. FWB: Cat I 3. Pain: epidural  Continue Pitocin. Anticipate SVD.  Donette Larry, CNM 10:19 AM

## 2018-09-14 NOTE — Progress Notes (Signed)
Patient ID: Kara Cole, female   DOB: [birth year], 23 y.o.   MRN: [id number] Kara Cole is a 22 y.o. G1P0 at [redacted]w[redacted]d admitted for PROM  Subjective: Comfortable w/ epidural  Objective: BP 121/72   Pulse 79   Temp 98.2 F (36.8 C) (Oral)   Resp 16   Ht 5\' 5"  (1.651 m)   Wt 108 kg   LMP 12/09/2017   SpO2 97%   BMI 39.62 kg/m  No intake/output data recorded.  FHT:  FHR: 120 bpm, variability: moderate,  accelerations:  Present,  decelerations:  Absent UC:   Not tracing very well, ~ q  SVE:   Dilation: 2.5 Effacement (%): 100 Station: 0 Exam by:: Hermenia Bers, RN  Labs: Lab Results  Component Value Date   WBC 9.9 09/13/2018   HGB 10.7 (L) 09/13/2018   HCT 33.4 (L) 09/13/2018   MCV 90.3 09/13/2018   PLT 157 09/13/2018    Assessment / Plan: PROM, early labor, fentanyl x few doses which wasn't helping, now has epidural, will start pitocin as needed  Labor: early Fetal Wellbeing:  Category I Pain Control:  Epidural Pre-eclampsia: n/a I/D:  pcn for gbs+ Anticipated MOD:  NSVD  Cheral Marker CNM, WHNP-BC 09/14/2018, 6:21 AM

## 2018-09-14 NOTE — Anesthesia Preprocedure Evaluation (Signed)

## 2018-09-15 ENCOUNTER — Encounter: Payer: PRIVATE HEALTH INSURANCE | Admitting: Family Medicine

## 2018-09-15 LAB — RPR: RPR: NONREACTIVE

## 2018-09-15 NOTE — Progress Notes (Addendum)
POSTPARTUM PROGRESS NOTE  Post Partum Day 1  Subjective:  [place] Wally Behan is a 22 y.o. G1P1001 s/p SVD at [redacted]w[redacted]d.  She reports she is doing well. No acute events overnight. She denies any problems with ambulating or po intake. No BM but passing gas. Denies nausea or vomiting.  Pain is well controlled.  Lochia is heavy, saturating a pad every 3-4 hours. Denies lightheadedness, dizziness, calf pain, SOB.  Objective: Blood pressure 118/81, pulse 84, temperature 98.3 F (36.8 C), temperature source Oral, resp. rate 18, height 5\' 5"  (1.651 m), weight 108 kg, last menstrual period 12/09/2017, SpO2 100 %, unknown if currently breastfeeding.  Physical Exam:  General: alert, cooperative and no distress Chest: no respiratory distress Heart:regular rate, distal pulses intact Abdomen: soft, nontender,  Uterine Fundus: firm, appropriately tender DVT Evaluation: No calf swelling or tenderness Extremities: no LE edema Skin: warm, dry  Recent Labs    09/13/18 2130 09/14/18 1803  HGB 10.7* 9.7*  HCT 33.4* 29.8*    Assessment/Plan: [place] Bridgett Larsson is a 22 y.o. G1P1001 s/p SVD at [redacted]w[redacted]d   PPD#1 - Doing well  Routine postpartum care Anemia:   Complicated by heavy vaginal bleeding but asymptomatic  Start ferrous sulfate and cont to monitor vitals Contraception: POPs Feeding: Breast Dispo: Plan for discharge tomorrow.   LOS: 2 days   Matthew Saras, [facility] MS3 09/15/2018, 7:36 AM    I confirm that I have verified the information documented in the  student's note and that I have also personally reperformed the physical exam and all medical decision making activities.  Luna Kitchens CNM

## 2018-09-15 NOTE — Lactation Note (Signed)
This note was copied from a baby's chart. Lactation Consultation Note  Patient Name: Kara Cole Date: 09/15/2018 Reason for consult: 1st time breastfeeding;Primapara;Other (Comment);Initial assessment(spiity )  Baby is 27 hours  LC reviewed doc flow sheets and updated per mom.  As LC entered the room, mom finishing lunch, dad sitting on the couch.  Baby woke up and was spitty, and LC used the bulb syringe x 2 and burped the  Baby several times, color pink. LC also changed a large meconium pasty diaper.  LC offered to assist to latch on the left breast / football/ and baby 1st tried to nibble onto the  Breast and LC recommended to mom not to allow the baby to do that. Baby needed to open wide and latch,which he did with breast compressions. LC assisted mom to obtain the depth , and showed her breast compressions. Baby fed for 18 mins, with multiple swallows, increased with breast compressions. When baby released nipple well rounded / mom was comfortable with feeding. Mom and dad mentioned this is the best the baby has fed since he was born.  LC reviewed nutritive - non - nutritive feeding patterns and the importance of watching for hanging out latched. LC stressed the importance STS feedings until the baby can stay awake for a feeding.  Mom and dad receptive to teaching and asked appropriate breast feeding questions.  Per mom has DEBP - Medela at home. LC instructed mom on the use of shells due to the areolas being semi compressible.  Mother informed of post-discharge support and given phone number to the lactation department, including services for phone call assistance; out-patient appointments; and breastfeeding support group. List of other breastfeeding resources in the community given in the handout. Encouraged mother to call for problems or concerns related to breastfeeding.   Maternal Data Has patient been taught Hand Expression?: Yes Does the patient have  breastfeeding experience prior to this delivery?: No  Feeding Feeding Type: Breast Fed  LATCH Score Latch: Grasps breast easily, tongue down, lips flanged, rhythmical sucking.  Audible Swallowing: Spontaneous and intermittent  Type of Nipple: Everted at rest and after stimulation  Comfort (Breast/Nipple): Soft / non-tender  Hold (Positioning): Assistance needed to correctly position infant at breast and maintain latch.  LATCH Score: 9  Interventions Interventions: Breast feeding basics reviewed;Assisted with latch;Skin to skin;Breast massage;Hand express;Breast compression;Adjust position;Support pillows;Position options;Shells  Lactation Tools Discussed/Used Tools: Shells Shell Type: Inverted WIC Program: No   Consult Status Consult Status: Follow-up Date: 09/16/18 Follow-up type: In-patient    Kara Cole 09/15/2018, 3:29 PM

## 2018-09-16 MED ORDER — SENNOSIDES-DOCUSATE SODIUM 8.6-50 MG PO TABS: 2.0000 | ORAL_TABLET | ORAL | 0 refills | Status: DC

## 2018-09-16 MED ORDER — INFLUENZA VAC SPLIT QUAD 0.5 ML IM SUSY
0.5000 mL | PREFILLED_SYRINGE | INTRAMUSCULAR | Status: AC
  Administered 2018-09-16: 0.5 mL via INTRAMUSCULAR

## 2018-09-16 MED ORDER — ACETAMINOPHEN 325 MG PO TABS: 650.0000 mg | ORAL_TABLET | ORAL | 0 refills | Status: DC | PRN

## 2018-09-16 MED ORDER — IBUPROFEN 600 MG PO TABS: 600.0000 mg | ORAL_TABLET | Freq: Four times a day (QID) | ORAL | 0 refills | Status: DC

## 2018-09-16 NOTE — Discharge Summary (Addendum)
Obstetrics Discharge Summary OB/GYN Faculty Practice   Patient Name: Kara Cole DOB: December 06, 1995 MRN: [id number]  Date of admission: 09/13/2018 Delivering MD: Burman Nieves A   Date of discharge: 09/16/2018  Admitting diagnosis: 39 WKS, CTX Intrauterine pregnancy: [redacted]w[redacted]d     Secondary diagnosis:   Active Problems:   Premature rupture of membranes   Normal labor  Discharge diagnosis: Term Pregnancy Delivered                                            Postpartum procedures: None  Complications: None  Hospital course: [place] Evalette Montrose is a 22 y.o. [redacted]w[redacted]d who was admitted for PROM. Her pregnancy was uncomplicated. Her labor course was notable for treatment for GBS with penicillin and augmentation with pitocin. Delivery was complicated by a periurethral laceration and 1st degree laceration, both of which were hemostatic and did not require repair. Please see delivery/op note for additional details. Her postpartum course was uncomplicated. She was breastfeeding without difficulty. By day of discharge, she was passing flatus, urinating, eating and drinking without difficulty. Her pain was well-controlled, and she was discharged home with ibuprofen. She will follow-up in clinic in 4 weeks.   Physical exam  Vitals:   09/15/18 0000 09/15/18 0400 09/15/18 1505 09/15/18 2252  BP: 127/76 118/81 118/89 137/88  Pulse: (!) 103 84 81 79  Resp: 18 18 18 18   Temp: 98.8 F (37.1 C) 98.3 F (36.8 C) 98.7 F (37.1 C)   TempSrc: Oral Oral Oral   SpO2: 99% 100%    Weight:      Height:       General: well appearing, no distress Lochia: appropriate Uterine Fundus: firm Incision: N/A DVT Evaluation: No evidence of DVT seen on physical exam. Negative Homan's sign. Labs: Lab Results  Component Value Date   WBC 15.0 (H) 09/14/2018   HGB 9.7 (L) 09/14/2018   HCT 29.8 (L) 09/14/2018   MCV 89.2 09/14/2018   PLT 146 (L) 09/14/2018   CMP Latest Ref Rng & Units 06/27/2016  Glucose 65  - 99 mg/dL 409(W)  BUN 6 - 20 mg/dL 12  Creatinine 1.19 - 1.47 mg/dL 8.29  Sodium 562 - 130 mmol/L 139  Potassium 3.5 - 5.1 mmol/L 3.5  Chloride 101 - 111 mmol/L 107  CO2 22 - 32 mmol/L 21(L)  Calcium 8.9 - 10.3 mg/dL 86.5    Discharge instructions: Per After Visit Summary and "Baby and Me Booklet"  After visit meds:  Allergies as of 09/16/2018   No Known Allergies     Medication List    STOP taking these medications   Butalbital-APAP-Caffeine 50-325-40 MG capsule   multivitamin-prenatal 27-0.8 MG Tabs tablet     TAKE these medications   acetaminophen 325 MG tablet Commonly known as:  TYLENOL Take 2 tablets (650 mg total) by mouth every 4 (four) hours as needed (for pain scale < 4). What changed:    medication strength  how much to take  when to take this  reasons to take this   ibuprofen 600 MG tablet Commonly known as:  ADVIL,MOTRIN Take 1 tablet (600 mg total) by mouth every 6 (six) hours.   senna-docusate 8.6-50 MG tablet Commonly known as:  Senokot-S Take 2 tablets by mouth daily. Start taking on:  09/17/2018       Postpartum contraception: Progesterone only pills Diet: Routine Diet Activity: Advance  as tolerated. Pelvic rest for 6 weeks.   Outpatient follow up:4 weeks Follow-up Appt:No future appointments. Follow-up Visit:No follow-ups on file.  Newborn Data: Live born female  Birth Weight: 7 lb 4 oz (3289 g) APGAR: 8, 9  Newborn Delivery   Birth date/time:  09/14/2018 12:21:00 Delivery type:  Vaginal, Spontaneous     Baby Feeding: Breast Disposition:home with mother  Burman Nieves, MD Family Medicine Resident   OB FELLOW DISCHARGE ATTESTATION  I have seen and examined this patient and agree with above documentation in the resident's note.   Gwenevere Abbot, MD OB Fellow  09/19/2018, 2:59 PM

## 2018-09-16 NOTE — Lactation Note (Signed)
This note was copied from a baby's chart. Lactation Consultation Note  Patient Name: Kara Cole Date: 09/16/2018 Reason for consult: Follow-up assessment;1st time breastfeeding;Primapara;Term  P1 mother whose infant is now 44 hours old  Mother was holding baby as I entered.  He was swaddled and sleeping.  Mother had many basic newborn and breast feeding questions which I answered to her satisfaction.  Father also with appropriate questions and receptive to learning.  Mother stated that baby usually feeds about 10 minutes and falls asleep easily.  She knows about STS, techniques to awaken a sleepy baby, doing breast compressions during feedings and gentle stimulation during feedings.  She feels like her breasts might be starting to feel a little bit heavier this morning.  Encouraged to continue feeding 8-12 times/24 hours or sooner if baby shows cues.  Mother is doing hand expression and will use her manual pump periodically to obtain extra colostrum which she feeds back to baby.    Engorgement prevention/treatment discussed.  Mother will return to work in 6 weeks and has a DEBP for home use.  She has our OP phone number for questions/concerns after discharge. Parents have a strong support system in place and maternal grandmother will care for baby when mother returns to work.     Maternal Data Formula Feeding for Exclusion: No Has patient been taught Hand Expression?: Yes Does the patient have breastfeeding experience prior to this delivery?: No  Feeding Feeding Type: Breast Fed  LATCH Score                   Interventions    Lactation Tools Discussed/Used WIC Program: No Initiated by:: Already initiated   Consult Status Consult Status: Complete Date: 09/16/18 Follow-up type: Call as needed    Matej Sappenfield R Clarise Chacko 09/16/2018, 9:59 AM

## 2018-09-16 NOTE — Discharge Instructions (Signed)

## 2018-10-04 ENCOUNTER — Encounter: Payer: Self-pay | Admitting: *Deleted

## 2018-10-25 ENCOUNTER — Ambulatory Visit: Payer: PRIVATE HEALTH INSURANCE | Admitting: Certified Nurse Midwife

## 2018-10-31 ENCOUNTER — Encounter: Payer: Self-pay | Admitting: Obstetrics & Gynecology

## 2018-10-31 ENCOUNTER — Ambulatory Visit (INDEPENDENT_AMBULATORY_CARE_PROVIDER_SITE_OTHER): Payer: PRIVATE HEALTH INSURANCE | Admitting: Obstetrics & Gynecology

## 2018-10-31 DIAGNOSIS — Z1389 Encounter for screening for other disorder: Secondary | ICD-10-CM

## 2018-10-31 NOTE — Progress Notes (Signed)
Post Partum Exam  [name] is a 22 y.o. 661P1001 female who presents for a postpartum visit. She is 6 weeks postpartum following a spontaneous vaginal delivery. I have fully reviewed the prenatal and intrapartum course. The delivery was at 39 gestational weeks 6 days.  Anesthesia: epidural. Postpartum course has been unremarkable. Baby's course has been unremarkable. Baby is feeding by breast. Bleeding no bleeding. Bowel function is normal. Bladder function is normal. Patient is sexually active. Contraception method is oral progesterone-only contraceptive. Postpartum depression screening:neg  The following portions of the patient's history were reviewed and updated as appropriate: allergies, current medications, past family history, past medical history, past social history, past surgical history and problem list. Last pap smear done 2019 and was Normal  Review of Systems Pertinent items are noted in HPI.    Objective:  Height 5\' 5"  (1.651 m), weight 215 lb (97.5 kg), last menstrual period 12/09/2017, currently breastfeeding.  General:  alert   Breasts:  inspection negative, no nipple discharge or bleeding, no masses or nodularity palpable  Lungs: clear to auscultation bilaterally  Heart:  regular rate and rhythm, S1, S2 normal, no murmur, click, rub or gallop  Abdomen: soft, non-tender; bowel sounds normal; no masses,  no organomegaly   Vulva:  normal  Vagina: normal vagina  Cervix:  anteverted  Corpus: normal size, contour, position, consistency, mobility, non-tender  Adnexa:  normal adnexa  Rectal Exam: Not performed.        Assessment:    normal postpartum exam. Pap smear not done at today's visit.   Plan:   1. Contraception: IUD in 2 weeks, abstinent until then

## 2018-11-15 ENCOUNTER — Ambulatory Visit (INDEPENDENT_AMBULATORY_CARE_PROVIDER_SITE_OTHER): Payer: PRIVATE HEALTH INSURANCE | Admitting: Certified Nurse Midwife

## 2018-11-15 ENCOUNTER — Encounter: Payer: Self-pay | Admitting: Certified Nurse Midwife

## 2018-11-15 VITALS — BP 103/68 | HR 82 | Resp 16 | Ht 64.0 in | Wt 216.0 lb

## 2018-11-15 DIAGNOSIS — Z3202 Encounter for pregnancy test, result negative: Secondary | ICD-10-CM

## 2018-11-15 DIAGNOSIS — Z30430 Encounter for insertion of intrauterine contraceptive device: Secondary | ICD-10-CM | POA: Diagnosis not present

## 2018-11-15 LAB — POCT URINE PREGNANCY: Preg Test, Ur: NEGATIVE

## 2018-11-15 MED ORDER — LEVONORGESTREL 19.5 MCG/DAY IU IUD: INTRAUTERINE_SYSTEM | Freq: Once | INTRAUTERINE | Status: DC

## 2018-11-15 MED ORDER — LEVONORGESTREL 20 MCG/24HR IU IUD
INTRAUTERINE_SYSTEM | Freq: Once | INTRAUTERINE | Status: AC
  Administered 2018-11-15: 11:00:00 via INTRAUTERINE

## 2018-11-15 NOTE — Progress Notes (Signed)
   GYNECOLOGY CLINIC PROCEDURE NOTE  Ms. Kara Cole is a 23 y.o. G1P1001 here for Mirena IUD insertion. No GYN concerns.  Last pap smear was on 03/2018 and was normal.  IUD Insertion Procedure Note Patient identified, informed consent performed.  Discussed risks of irregular bleeding, cramping, infection, malpositioning or misplacement of the IUD outside the uterus which may require further procedure such as laparoscopy. Time out was performed.  Urine pregnancy test negative.  Speculum placed in the vagina.  Cervix visualized.  Cleaned with Betadine x 2.  Uterus sounded to 7.5 cm.  Mirena IUD placed per manufacturer's recommendations.  Strings trimmed to 3 cm. Good hemostasis noted.  Patient tolerated procedure well.   Patient was given post-procedure instructions.  She was advised to be have backup contraception for one week.  Patient was also asked to check IUD strings periodically and follow up in 4 weeks for IUD check.  Sharyon Cable, CNM 11/15/2018 10:31 AM

## 2018-11-15 NOTE — Patient Instructions (Signed)

## 2018-12-13 ENCOUNTER — Encounter: Payer: Self-pay | Admitting: Neurology

## 2018-12-13 ENCOUNTER — Ambulatory Visit (INDEPENDENT_AMBULATORY_CARE_PROVIDER_SITE_OTHER): Payer: PRIVATE HEALTH INSURANCE | Admitting: Certified Nurse Midwife

## 2018-12-13 ENCOUNTER — Encounter: Payer: Self-pay | Admitting: Certified Nurse Midwife

## 2018-12-13 VITALS — BP 114/61 | HR 83 | Ht 65.0 in | Wt 216.0 lb

## 2018-12-13 DIAGNOSIS — Z975 Presence of (intrauterine) contraceptive device: Secondary | ICD-10-CM

## 2018-12-13 DIAGNOSIS — G43709 Chronic migraine without aura, not intractable, without status migrainosus: Secondary | ICD-10-CM

## 2018-12-13 NOTE — Progress Notes (Signed)
Pt c/o headaches since getting the IUD

## 2018-12-13 NOTE — Progress Notes (Signed)
GYNECOLOGY OFFICE VISIT NOTE  History:  23 y.o. G1P1001 here today for IUD string check. Mirena IUD was placed on 11/15/2018. She denies any abnormal vaginal discharge, pelvic pain or other concerns. Having spotting, light pink. No dyspareunia.   Past Medical History:  Diagnosis Date  . ADHD (attention deficit hyperactivity disorder)   . Bilateral ovarian cysts   . Headache     History reviewed. No pertinent surgical history.  The following portions of the patient's history were reviewed and updated as appropriate: allergies, current medications, past family history, past medical history, past social history, past surgical history and problem list.   Review of Systems:  Pertinent items noted in HPI and remainder of comprehensive ROS otherwise negative.  Objective:  Physical Exam BP 114/61   Pulse 83   Ht 5\' 5"  (1.651 m)   Wt 216 lb (98 kg)   LMP 11/02/2018   Breastfeeding Yes   BMI 35.94 kg/m  CONSTITUTIONAL: Well-developed, well-nourished female in no acute distress.  HENT:  Normocephalic, atraumatic.  SKIN: Skin is warm and dry. No rash noted. Not diaphoretic. No erythema. No pallor. NEUROLOGIC: Alert and oriented to person, place, and time.  PSYCHIATRIC: Normal mood and affect. Normal behavior. Normal judgment and thought content. CARDIOVASCULAR: Normal heart rate noted RESPIRATORY: Effort and rate normal ABDOMEN: Soft, no distention noted.   PELVIC: Normal appearing external genitalia; normal appearing vaginal mucosa and cervix. IUD string seen. No abnormal discharge noted.    Assessment & Plan:  1. IUD (intrauterine device) in place - No problems or concerns with IUD  - Educated and discussed reasons to return, follow up in 1 year for annual examination   2. Chronic migraine without aura without status migrainosus, not intractable - Patient reports HA that occurs 4x a week, takes Tylenol and Excedrin with little relief  - Has hx of HA prior to getting pregnant, during  third trimester and now with IUD/PP - Has never seen neuro for chronic HA or been on HA medication, discussed referral to neurology to manage  - Ambulatory referral to Neurology  Please refer to After Visit Summary for other counseling recommendations.   Return in about 1 year (around 12/14/2019) for WELL WOMAN VISIT.   Sharyon Cable, CNM 12/13/2018 4:27 PM
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
## 2018-12-30 ENCOUNTER — Ambulatory Visit: Payer: PRIVATE HEALTH INSURANCE | Admitting: Neurology

## 2019-01-02 ENCOUNTER — Encounter: Payer: Self-pay | Admitting: *Deleted

## 2019-01-19 ENCOUNTER — Emergency Department (HOSPITAL_BASED_OUTPATIENT_CLINIC_OR_DEPARTMENT_OTHER): Payer: No Typology Code available for payment source

## 2019-01-19 ENCOUNTER — Encounter (HOSPITAL_BASED_OUTPATIENT_CLINIC_OR_DEPARTMENT_OTHER): Payer: Self-pay | Admitting: Emergency Medicine

## 2019-01-19 ENCOUNTER — Other Ambulatory Visit: Payer: Self-pay

## 2019-01-19 ENCOUNTER — Emergency Department (HOSPITAL_BASED_OUTPATIENT_CLINIC_OR_DEPARTMENT_OTHER)
Admission: EM | Admit: 2019-01-19 | Discharge: 2019-01-20 | Disposition: A | Discharge status: Home / Self Care | Payer: No Typology Code available for payment source | Attending: Emergency Medicine | Admitting: Emergency Medicine

## 2019-01-19 DIAGNOSIS — Z79899 Other long term (current) drug therapy: Secondary | ICD-10-CM | POA: Insufficient documentation

## 2019-01-19 DIAGNOSIS — F909 Attention-deficit hyperactivity disorder, unspecified type: Secondary | ICD-10-CM | POA: Insufficient documentation

## 2019-01-19 DIAGNOSIS — W172XXA Fall into hole, initial encounter: Secondary | ICD-10-CM | POA: Insufficient documentation

## 2019-01-19 DIAGNOSIS — S93491A Sprain of other ligament of right ankle, initial encounter: Secondary | ICD-10-CM | POA: Insufficient documentation

## 2019-01-19 DIAGNOSIS — Y999 Unspecified external cause status: Secondary | ICD-10-CM | POA: Diagnosis not present

## 2019-01-19 DIAGNOSIS — Y9301 Activity, walking, marching and hiking: Secondary | ICD-10-CM | POA: Insufficient documentation

## 2019-01-19 DIAGNOSIS — Y9289 Other specified places as the place of occurrence of the external cause: Secondary | ICD-10-CM | POA: Diagnosis not present

## 2019-01-19 DIAGNOSIS — S99911A Unspecified injury of right ankle, initial encounter: Secondary | ICD-10-CM | POA: Diagnosis present

## 2019-01-19 MED ORDER — ACETAMINOPHEN 325 MG PO TABS
650.0000 mg | ORAL_TABLET | Freq: Once | ORAL | Status: AC
  Administered 2019-01-19: 650 mg via ORAL
  Filled 2019-01-19: qty 2

## 2019-01-19 NOTE — ED Triage Notes (Signed)
Pt states she fell today after work and had an injury on the right ankle, pt unable to put any weight on her ankle. Pt c/o 7/10 pain on triage.

## 2019-01-20 NOTE — ED Provider Notes (Signed)
[facility] EMERGENCY DEPARTMENT Provider Note   CSN: [id number] Arrival date & time: 01/19/19  2120  History   Chief Complaint Chief Complaint  Patient presents with  . Ankle Pain   HPI Kara Cole is a 23 y.o. female with past medical history significant for ADHD, chronic headache who presents for evaluation of right ankle pain.  Patient states she was walking in the grass when her foot fell into a shallow area.  Patient states she felt a pop to her right ankle and felt immediate pain.  Patient was unable to bear weight immediately after the incident.  Patient states she noticed swelling to the right ankle and foot.  Not take anything for pain PTA.  She rates her pain a 6/10.  Pain does not radiate.  Patient states she is currently breast-feeding and does not want anything stronger than Tylenol at this time.  Denies fever, chills, nausea, vomiting, decreased range of motion, numbness or tingling, redness or bruising to her lower extremities.  Denies additional aggravating or alleviating factors.  Patient has been able to ambulate since incident secondary to pain.  History obtained from patient.  No interpreter is used.    HPI  Past Medical History:  Diagnosis Date  . ADHD (attention deficit hyperactivity disorder)   . Bilateral ovarian cysts   . Headache     Patient Active Problem List   Diagnosis Date Noted  . Normal labor 09/14/2018  . Premature rupture of membranes 09/13/2018  . Group B Streptococcus carrier state affecting pregnancy 08/23/2018  . Supervision of normal first pregnancy, antepartum 03/31/2018  . Obesity in pregnancy 03/31/2018  . Low back pain 02/21/2015    History reviewed. No pertinent surgical history.   OB History    Gravida  1   Para  1   Term  1   Preterm      AB      Living  1     SAB      TAB      Ectopic      Multiple  0   Live Births  1            Home Medications    Prior to Admission  medications   Medication Sig Start Date End Date Taking? Authorizing Provider  acetaminophen (TYLENOL) 325 MG tablet Take 2 tablets (650 mg total) by mouth every 4 (four) hours as needed (for pain scale < 4). 09/16/18   Arabella Merles, CNM  ibuprofen (ADVIL,MOTRIN) 600 MG tablet Take 1 tablet (600 mg total) by mouth every 6 (six) hours. Patient not taking: Reported on 12/13/2018 09/16/18   Arabella Merles, CNM  levonorgestrel [facility]) 20 MCG/24HR IUD 1 each by Intrauterine route once.    [provider]  Prenatal Vit-Fe Fumarate-FA (MULTIVITAMIN-PRENATAL) 27-0.8 MG TABS tablet Take 1 tablet by mouth daily at 12 noon.    [provider]    Family History Family History  Problem Relation Age of Onset  . Hypertension Mother   . Thyroid disease Mother   . Depression Mother   . Hyperlipidemia Father   . ADD / ADHD Father   . Hypertension Father   . Thyroid disease Father   . Anxiety disorder Sister   . ADD / ADHD Sister   . ADD / ADHD Brother   . Depression Brother   . Stroke Maternal Grandfather   . Cancer Paternal Grandmother        brain  . Stroke  Paternal Grandmother     Social History Social History   Tobacco Use  . Smoking status: Never Smoker  . Smokeless tobacco: Never Used  Substance Use Topics  . Alcohol use: Not Currently    Comment: Occasionally  . Drug use: No     Allergies   Patient has no known allergies.   Review of Systems Review of Systems  Constitutional: Negative.   HENT: Negative.   Respiratory: Negative.   Cardiovascular: Negative.   Gastrointestinal: Negative.   Genitourinary: Negative.   Musculoskeletal: Positive for gait problem.       Right ankle pain.  Skin: Negative.   All other systems reviewed and are negative.    Physical Exam Updated Vital Signs BP 130/87 (BP Location: Right Arm)   Pulse 84   Temp 98.3 F (36.8 C) (Oral)   Resp 18   Ht 5\' 5"  (1.651 m)   Wt 109.3 kg   SpO2 100%   BMI 40.10 kg/m    Physical Exam Vitals signs and nursing note reviewed.  Constitutional:      General: She is not in acute distress.    Appearance: She is well-developed. She is not ill-appearing, toxic-appearing or diaphoretic.  HENT:     Head: Normocephalic and atraumatic.     Nose: Nose normal.     Mouth/Throat:     Mouth: Mucous membranes are moist.     Pharynx: Oropharynx is clear.  Eyes:     Pupils: Pupils are equal, round, and reactive to light.  Neck:     Musculoskeletal: Normal range of motion.  Cardiovascular:     Rate and Rhythm: Normal rate.     Pulses: Normal pulses.     Heart sounds: Normal heart sounds.     Comments: 2+ DP, PT pulses Pulmonary:     Effort: Pulmonary effort is normal. No respiratory distress.     Breath sounds: Normal breath sounds.  Abdominal:     General: Bowel sounds are normal. There is no distension.  Musculoskeletal: Normal range of motion.     Comments: Tenderness to palpation to right lateral malleolus as well as ATFL.  Mild tenderness over dorsal surface of right foot.  No tenderness to proximal tibia or fibula.  Moderate swelling to ankle and foot.  Able to plantarflex and dorsiflex bilateral lower extremities.  Unable to invert at right ankle secondary to pain.  Lower extremity compartments are soft.  Skin:    General: Skin is warm and dry.     Comments: No erythema, ecchymosis or warmth to bilateral lower extremities.  No contusions or abrasions.  Brisk capillary refill.  Neurological:     Mental Status: She is alert.     Comments: Intact sensation sharp and dull bilateral lower extremities.      ED Treatments / Results  Labs (all labs ordered are listed, but only abnormal results are displayed) Labs Reviewed - No data to display  EKG None  Radiology Dg Ankle Complete Right  Result Date: 01/19/2019 CLINICAL DATA:  Initial evaluation for acute injury.  Swelling. EXAM: RIGHT ANKLE - COMPLETE 3+ VIEW COMPARISON:  Prior radiograph from 04/19/2014.  FINDINGS: There is no evidence of fracture, dislocation, or joint effusion. There is no evidence of arthropathy or other focal bone abnormality. Mild diffuse soft tissue swelling about the ankle. IMPRESSION: 1. No acute osseous abnormality. 2. Mild diffuse soft tissue swelling about the ankle. Electronically Signed   By: Rise Mu M.D.   On: 01/19/2019 22:49  Dg Foot Complete Right  Result Date: 01/19/2019 CLINICAL DATA:  23 year old female status post fall at work with pain and swelling. EXAM: RIGHT FOOT COMPLETE - 3+ VIEW COMPARISON:  Right foot series 04/19/2014. FINDINGS: Bone mineralization is within normal limits. There is no evidence of fracture or dislocation. Small accessory ossicle suspected lateral to the cuboid. There is no evidence of arthropathy or other focal bone abnormality. No discrete soft tissue injury. IMPRESSION: No acute fracture or dislocation identified about the right foot. Electronically Signed   By: Odessa Fleming M.D.   On: 01/19/2019 23:47    Procedures Procedures (including critical care time)  Medications Ordered in ED Medications  acetaminophen (TYLENOL) tablet 650 mg (650 mg Oral Given 01/19/19 2321)     Initial Impression / Assessment and Plan / ED Course  I have reviewed the triage vital signs and the nursing notes.  Pertinent labs & imaging results that were available during my care of the patient were reviewed by me and considered in my medical decision making (see chart for details).  67 old female appears otherwise well presents for evaluation after mechanical fall.  Afebrile, nonseptic, non-ill-appearing.  Did not hit head.  Tenderness to lateral malleolus as well as ATFL on right lower extremity.  Diffuse tenderness to dorsal surface of right foot.  Unable to ambulate secondary to pain.  Full range of motion with plantarflexion dorsiflexion, however unable to invert at right lower extremity.  Lower extremity compartments are soft.  She does have  moderate swelling to her right lateral malleolus.  No erythema, warmth.  No tenderness to proximal tibia or fibula.  X-ray right ankle foot negative for acute fracture dislocation.  Given location of pain ATFL possible sprain or strain.  Will place patient in splint, provide crutches and have patient follow-up with orthopedics.  Discussed RICE for symptomatic management.  Patient neurovascularly intact.  No evidence of septic joint, gout, hemarthrosis, fracture or dislocation.  Discussed strict return precautions patient.  Patient voiced understanding and is agreeable to follow-up.  Patient hemodynamically stable and appropriate for DC home at this time.     Final Clinical Impressions(s) / ED Diagnoses   Final diagnoses:  Sprain of anterior talofibular ligament of right ankle, initial encounter    ED Discharge Orders    None       Pink Maye A, PA-C 01/20/19 0032    Arby Barrette, MD 01/21/19 1131

## 2019-01-20 NOTE — Discharge Instructions (Addendum)
Your evaluated today after fall pain to your right ankle.  We have placed you in a splint and given you crutches.  I would suggest Tylenol, ice, elevation of the extremity.  If you continue to have symptoms beyond the next 3 to 4 days please follow-up with orthopedics.  I have given you their contact information on your discharge paperwork.  You need to call them to schedule an appointment.  Return to the ED for any new or worsening symptoms.

## 2019-01-30 ENCOUNTER — Other Ambulatory Visit: Payer: Self-pay

## 2019-01-30 ENCOUNTER — Ambulatory Visit (INDEPENDENT_AMBULATORY_CARE_PROVIDER_SITE_OTHER): Payer: No Typology Code available for payment source | Admitting: Family Medicine

## 2019-01-30 ENCOUNTER — Encounter: Payer: Self-pay | Admitting: Family Medicine

## 2019-01-30 VITALS — BP 123/78 | HR 79 | Temp 98.7°F | Ht 65.0 in | Wt 214.0 lb

## 2019-01-30 DIAGNOSIS — M79671 Pain in right foot: Secondary | ICD-10-CM

## 2019-01-30 NOTE — Patient Instructions (Signed)
You have a small avulsion off your cuboid bone of your foot. Ice the area for 15 minutes at a time, 3-4 times a day Aleve 2 tabs twice a day with food OR ibuprofen 3 tabs three times a day with food for pain and inflammation only if needed. Elevate above the level of your heart when possible Crutches if needed to help with walking Bear weight when tolerated Use boot when up and walking around to help with stability while you recover from this injury. Come out of the boot twice a day to do Up/down and alphabet exercises 2-3 sets of each. Contact me in 4 weeks to let me know how you're doing - this should be significantly better if not completely better by that time.

## 2019-01-30 NOTE — Progress Notes (Signed)
  Kara Cole - 23 y.o. female MRN [id number]  Date of birth: 1996-03-17  SUBJECTIVE:      Chief Complaint: Right foot pain  HPI:  Patient reports injuring her right foot/ankle on 01/19/2019.  Patient was leaving work walking across some grass and stepped into a divot.  She reports rolling her ankle and hearing a snap.  She was unable to bear weight and was carried to her car.  She was seen at the emergency department where x-rays were obtained and she was discharged in a Aircast.  She has been wearing the Aircast but reports this is causing her more pain.  Currently at 4/10 level.  She denies having any pain in the ankle.  She reports all of her pain over the lateral aspect of the foot.  She had been using crutches up until 2 days ago but now has been weightbearing despite the pain.  She is been taking Tylenol regularly and using ice which is helpful.  She reports swelling and bruising over the lateral foot.  No numbness or tingling.  ROS:     See HPI. All other reviewed systems negative.  PERTINENT  PMH / PSH FH / / SH:  Past Medical, Surgical, Social, and Family History Reviewed & Updated in the EMR.    OBJECTIVE: BP 123/78   Pulse 79   Temp 98.7 F (37.1 C) (Oral)   Ht 5\' 5"  (1.651 m)   BMI 40.10 kg/m   Physical Exam:  Vital signs are reviewed.  GEN: Alert and oriented, NAD Pulm: Breathing unlabored PSY: normal mood, congruent affect  MSK: Right foot/ankle: - Inspection: No obvious bony deformity.  There is swelling and ecchymosis over the lateral foot. - Palpation: She is diffusely tender over the lateral foot at the third through fifth metatarsals.  She is also tender at the ATFL.  She seems to be most tender over the cuboid laterally. - Strength: Limited due to pain - ROM: Normal plantar flexion.  Limited dorsiflexion inversion and eversion - Neuro/vasc: NV intact - Special Tests: Negative anterior drawer. Negative syndesmotic compression.  MSK [place]: Limited  ultrasound of the lateral right foot.  The third, fourth, and fifth metatarsals show no acute bony abnormalities or cortical swelling.  The peroneus longus and brevis tendons are intact without tenosynovitis.  The lateral aspect of the cuboid was visualized.  There is a small bony fragment consistent with x-rays from the emergency department.  This is surrounded by some fluid.  No increased neovascularity.  Left ankle: No deformity Full ROM with 5/5 strength NV intact  ASSESSMENT & PLAN:  1.  Right foot pain- x-rays from the emergency department were independently reviewed today.  Based on the patient location of pain and swelling on ultrasound, it is possible that the suspected ossicle lateral to the cuboid is in fact a small avulsion. - We will place in a walking boot - Weightbearing as tolerated - May return to work - Continue Tylenol or Motrin as needed for pain - Ice - Follow-up 4 weeks by phone if doing well.

## 2019-02-03 ENCOUNTER — Ambulatory Visit: Payer: PRIVATE HEALTH INSURANCE | Admitting: Neurology

## 2019-02-23 NOTE — Consult Note (Signed)
Patient no-showed for virtual visit. 

## 2019-02-24 ENCOUNTER — Encounter: Payer: Self-pay | Admitting: Neurology

## 2019-02-24 ENCOUNTER — Telehealth (INDEPENDENT_AMBULATORY_CARE_PROVIDER_SITE_OTHER): Payer: No Typology Code available for payment source | Admitting: Neurology

## 2019-02-24 ENCOUNTER — Other Ambulatory Visit: Payer: Self-pay

## 2019-02-27 NOTE — Progress Notes (Signed)
Patient no-showed for virtual visit. 

## 2019-04-24 ENCOUNTER — Ambulatory Visit: Payer: PRIVATE HEALTH INSURANCE | Admitting: Neurology

## 2019-10-02 ENCOUNTER — Emergency Department (INDEPENDENT_AMBULATORY_CARE_PROVIDER_SITE_OTHER)
Admission: EM | Admit: 2019-10-02 | Discharge: 2019-10-02 | Disposition: A | Discharge status: Home / Self Care | Payer: PRIVATE HEALTH INSURANCE | Source: Home / Self Care

## 2019-10-02 ENCOUNTER — Other Ambulatory Visit: Payer: Self-pay

## 2019-10-02 DIAGNOSIS — J029 Acute pharyngitis, unspecified: Secondary | ICD-10-CM | POA: Diagnosis not present

## 2019-10-02 DIAGNOSIS — R058 Other specified cough: Secondary | ICD-10-CM

## 2019-10-02 DIAGNOSIS — J020 Streptococcal pharyngitis: Secondary | ICD-10-CM

## 2019-10-02 DIAGNOSIS — R5383 Other fatigue: Secondary | ICD-10-CM

## 2019-10-02 DIAGNOSIS — R05 Cough: Secondary | ICD-10-CM

## 2019-10-02 DIAGNOSIS — R519 Headache, unspecified: Secondary | ICD-10-CM

## 2019-10-02 LAB — POCT RAPID STREP A (OFFICE): Rapid Strep A Screen: NEGATIVE

## 2019-10-02 NOTE — Discharge Instructions (Signed)
° ° °  Due to concern for possibly having Covid-19, it is advised that you self-isolate at home until test results come back in 2-5 days.  If positive, it is recommended you stay isolated for at least 10 days after symptom onset and 24 hours after last fever without taking medication (whichever is longer), with improving symptoms.  If you MUST go out, please wear a mask at all times, limit contact with others.  ° °

## 2019-10-02 NOTE — ED Provider Notes (Signed)
[facility] CARE    CSN: [id number] Arrival date & time: 10/02/19  5732      History   Chief Complaint Chief Complaint  Patient presents with  . Sore Throat    HPI Kara Cole is a 23 y.o. female.   HPI Kara Cole is a 23 y.o. female presenting to UC with c/o 6 days of mild sore throat, fatigue, and nausea.  Mild chest tightness started today. She notes her son had a cold last week but is better now. No other known sick contacts. She is requesting a Covid test today. Denies fever, chills, n/v/d.    Past Medical History:  Diagnosis Date  . ADHD (attention deficit hyperactivity disorder)   . Bilateral ovarian cysts   . Headache     Patient Active Problem List   Diagnosis Date Noted  . Normal labor 09/14/2018  . Premature rupture of membranes 09/13/2018  . Group B Streptococcus carrier state affecting pregnancy 08/23/2018  . Supervision of normal first pregnancy, antepartum 03/31/2018  . Obesity in pregnancy 03/31/2018  . Low back pain 02/21/2015    History reviewed. No pertinent surgical history.  OB History    Gravida  1   Para  1   Term  1   Preterm      AB      Living  1     SAB      TAB      Ectopic      Multiple  0   Live Births  1            Home Medications    Prior to Admission medications   Medication Sig Start Date End Date Taking? Authorizing Provider  acetaminophen (TYLENOL) 325 MG tablet Take 2 tablets (650 mg total) by mouth every 4 (four) hours as needed (for pain scale < 4). 09/16/18   Arabella Merles, CNM  ibuprofen (ADVIL,MOTRIN) 600 MG tablet Take 1 tablet (600 mg total) by mouth every 6 (six) hours. Patient not taking: Reported on 12/13/2018 09/16/18   Arabella Merles, CNM  levonorgestrel [facility]) 20 MCG/24HR IUD 1 each by Intrauterine route once.    [provider]  Prenatal Vit-Fe Fumarate-FA (MULTIVITAMIN-PRENATAL) 27-0.8 MG TABS tablet Take 1 tablet by mouth daily at 12 noon.     [provider]    Family History Family History  Problem Relation Age of Onset  . Hypertension Mother   . Thyroid disease Mother   . Depression Mother   . Hyperlipidemia Father   . ADD / ADHD Father   . Hypertension Father   . Thyroid disease Father   . Anxiety disorder Sister   . ADD / ADHD Sister   . ADD / ADHD Brother   . Depression Brother   . Stroke Maternal Grandfather   . Cancer Paternal Grandmother        brain  . Stroke Paternal Grandmother     Social History Social History   Tobacco Use  . Smoking status: Never Smoker  . Smokeless tobacco: Never Used  Substance Use Topics  . Alcohol use: Not Currently    Comment: Occasionally  . Drug use: No     Allergies   Patient has no known allergies.   Review of Systems Review of Systems  Constitutional: Positive for fatigue. Negative for chills and fever.  HENT: Positive for congestion and sore throat. Negative for ear pain, trouble swallowing and voice change.   Respiratory: Positive for cough  and chest tightness. Negative for shortness of breath.   Cardiovascular: Negative for chest pain and palpitations.  Gastrointestinal: Positive for nausea. Negative for abdominal pain, diarrhea and vomiting.  Musculoskeletal: Negative for arthralgias, back pain and myalgias.  Skin: Negative for rash.     Physical Exam Triage Vital Signs ED Triage Vitals  Enc Vitals Group     BP 10/02/19 0846 (!) 130/91     Pulse Rate 10/02/19 0846 78     Resp 10/02/19 0846 18     Temp 10/02/19 0846 98.8 F (37.1 C)     Temp Source 10/02/19 0846 Oral     SpO2 10/02/19 0846 98 %     Weight 10/02/19 0848 225 lb (102.1 kg)     Height --      Head Circumference --      Peak Flow --      Pain Score 10/02/19 0847 3     Pain Loc --      Pain Edu? --      Excl. in [place]? --    No data found.  Updated Vital Signs BP (!) 130/91 (BP Location: Left Arm)   Pulse 78   Temp 98.8 F (37.1 C) (Oral)   Resp 18   Wt 225 lb  (102.1 kg)   SpO2 98%   BMI 37.44 kg/m   Visual Acuity Right Eye Distance:   Left Eye Distance:   Bilateral Distance:    Right Eye Near:   Left Eye Near:    Bilateral Near:     Physical Exam Vitals signs and nursing note reviewed.  Constitutional:      General: She is not in acute distress.    Appearance: She is well-developed. She is not ill-appearing, toxic-appearing or diaphoretic.  HENT:     Head: Normocephalic and atraumatic.     Right Ear: Tympanic membrane and ear canal normal.     Left Ear: Tympanic membrane and ear canal normal.     Nose: Nose normal.     Right Sinus: No maxillary sinus tenderness or frontal sinus tenderness.     Left Sinus: No maxillary sinus tenderness or frontal sinus tenderness.     Mouth/Throat:     Lips: Pink.     Mouth: Mucous membranes are moist.     Pharynx: Oropharynx is clear. Uvula midline. Posterior oropharyngeal erythema present. No pharyngeal swelling, oropharyngeal exudate or uvula swelling.  Neck:     Musculoskeletal: Normal range of motion and neck supple.  Cardiovascular:     Rate and Rhythm: Normal rate and regular rhythm.  Pulmonary:     Effort: Pulmonary effort is normal. No respiratory distress.     Breath sounds: Normal breath sounds. No stridor. No wheezing, rhonchi or rales.  Musculoskeletal: Normal range of motion.  Skin:    General: Skin is warm and dry.  Neurological:     Mental Status: She is alert and oriented to person, place, and time.  Psychiatric:        Behavior: Behavior normal.      UC Treatments / Results  Labs (all labs ordered are listed, but only abnormal results are displayed) Labs Reviewed  SARS CORONAVIRUS 2 (TAT 6-24 HRS)  NOVEL CORONAVIRUS, NAA  STREP A DNA PROBE  POCT RAPID STREP A (OFFICE)    EKG   Radiology No results found.  Procedures Procedures (including critical care time)  Medications Ordered in UC Medications - No data to display  Initial Impression / Assessment and  Plan / UC Course  I have reviewed the triage vital signs and the nursing notes.  Pertinent labs & imaging results that were available during my care of the patient were reviewed by me and considered in my medical decision making (see chart for details).    Rapid strep performed in triage: Negative No evidence of bacterial infection at this time Covid test: pending AVS provided  Final Clinical Impressions(s) / UC Diagnoses   Final diagnoses:  Acute pharyngitis, unspecified etiology  Dry cough  Generalized headache  Fatigue, unspecified type     Discharge Instructions       Due to concern for possibly having Covid-19, it is advised that you self-isolate at home until test results come back in 2-5 days.  If positive, it is recommended you stay isolated for at least 10 days after symptom onset and 24 hours after last fever without taking medication (whichever is longer), with improving symptoms.  If you MUST go out, please wear a mask at all times, limit contact with others.      ED Prescriptions    None     PDMP not reviewed this encounter.   Lurene Shadowhelps, Jakirah Zaun O, PA-C 10/02/19 1055

## 2019-10-02 NOTE — ED Triage Notes (Addendum)
Pt. Is here with a sore throat, fatigue, nausea since Wednesday. Complaints of chest tightness like someone is sitting on her chest, Wants COVID testing.

## 2019-10-03 LAB — STREP A DNA PROBE: Group A Strep Probe: NOT DETECTED

## 2019-10-04 LAB — NOVEL CORONAVIRUS, NAA: SARS-CoV-2, NAA: NOT DETECTED

## 2019-10-07 IMAGING — US US MFM OB COMP +14 WKS
1 series · 14 of 28 positions shown · non-contrast
Comparison: none

[Series 1: us mfm ob comp +14 wks · 106 acquisitions, 14 frames shown]
[im 4/106]
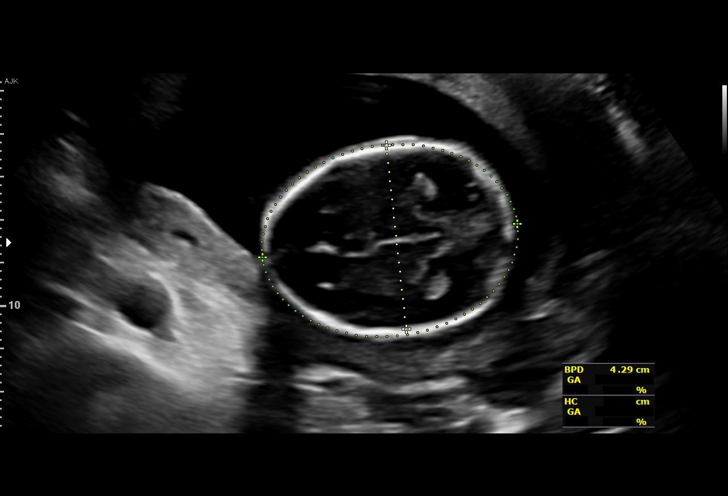
[im 12/106]
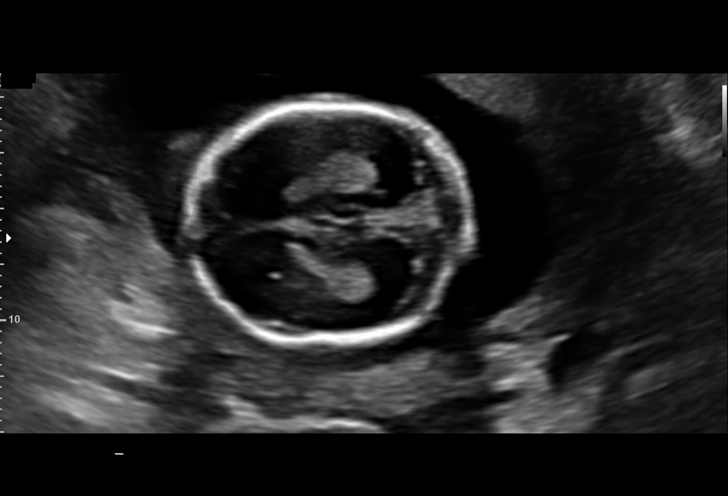
[im 20/106]
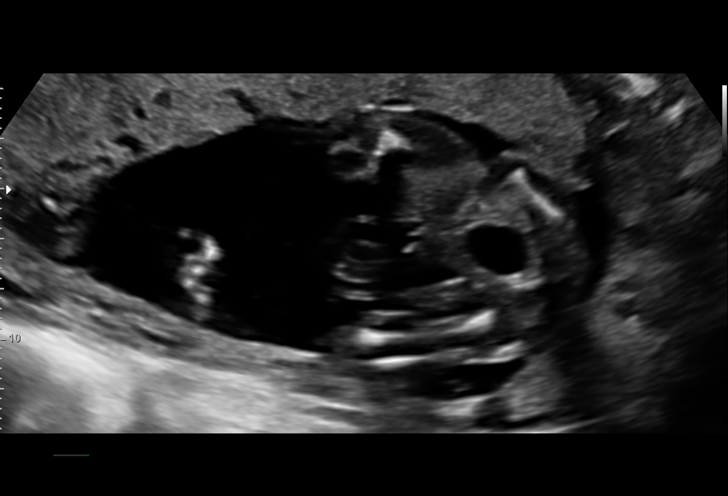
[im 28/106]
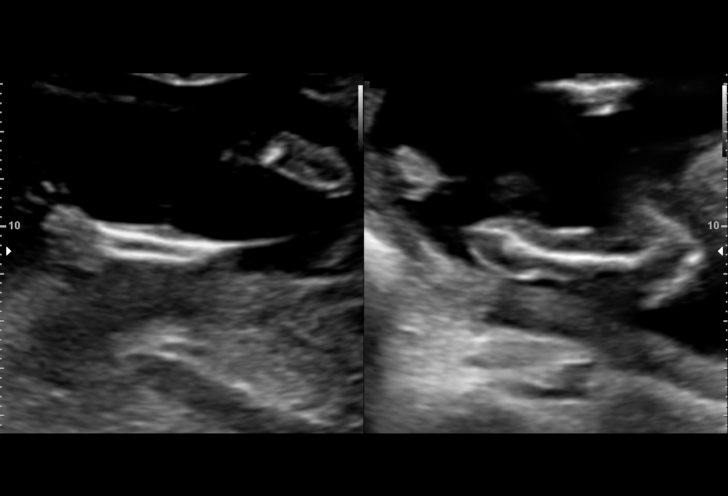
[im 36/106]
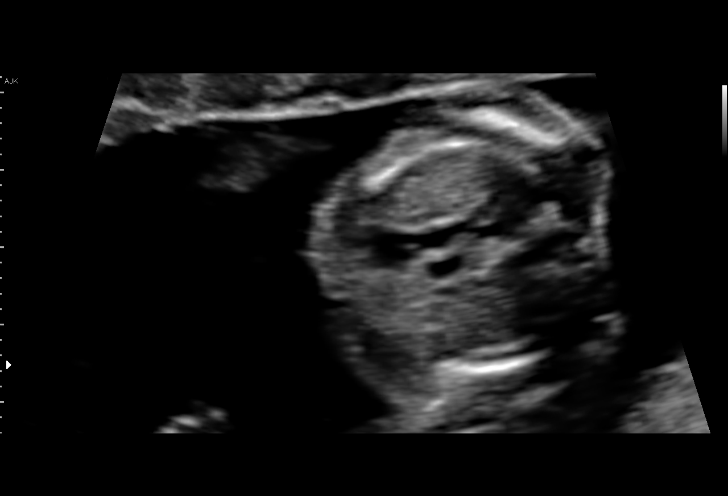
[im 43/106]
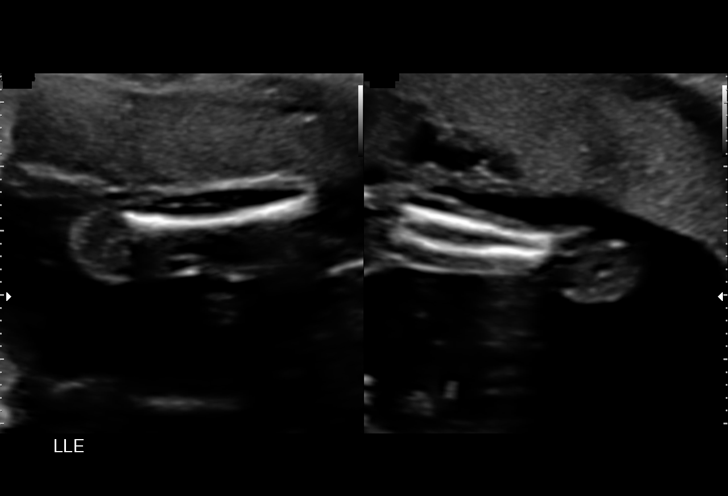
[im 51/106]
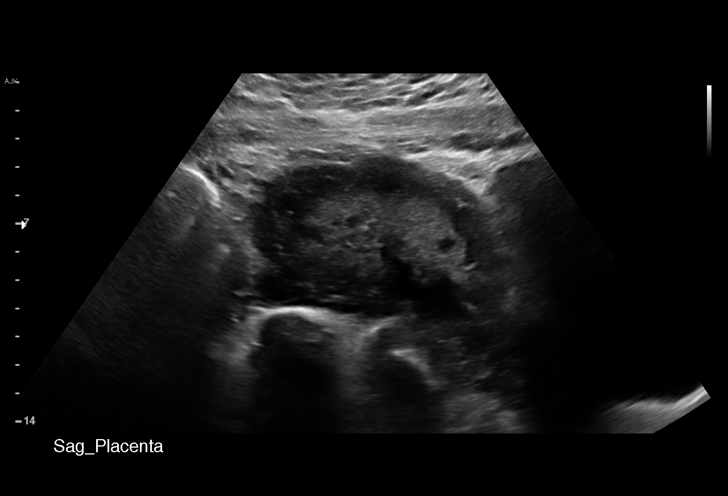
[im 59/106]
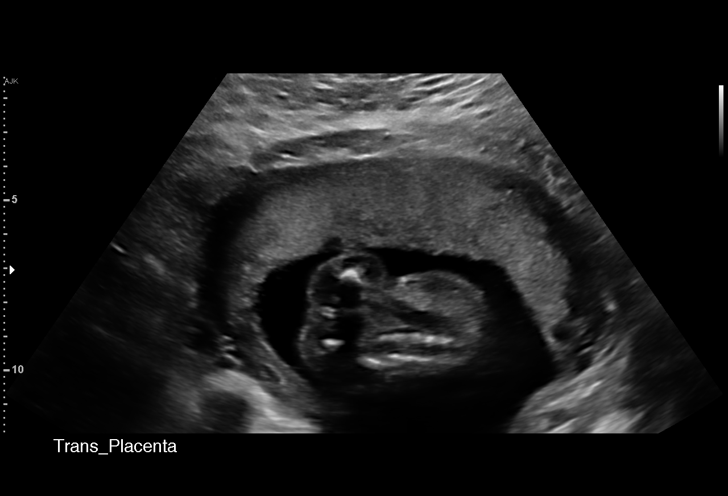
[im 67/106]
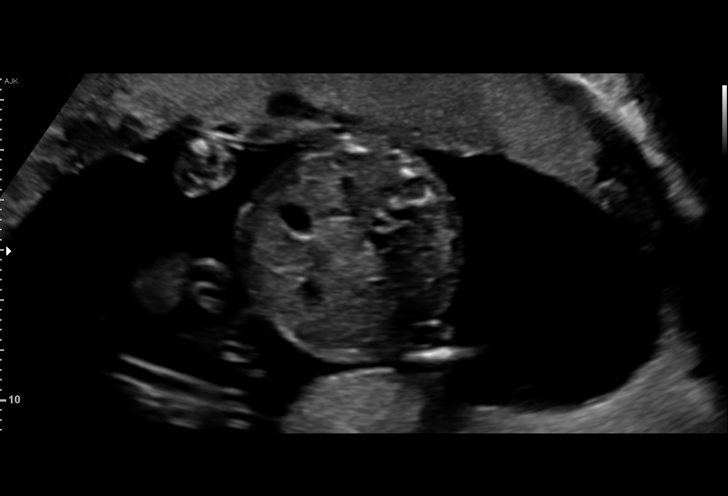
[im 74/106]
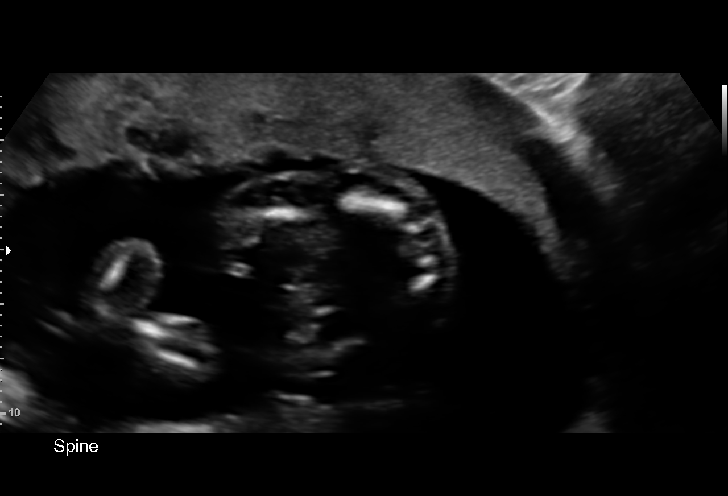
[im 82/106]
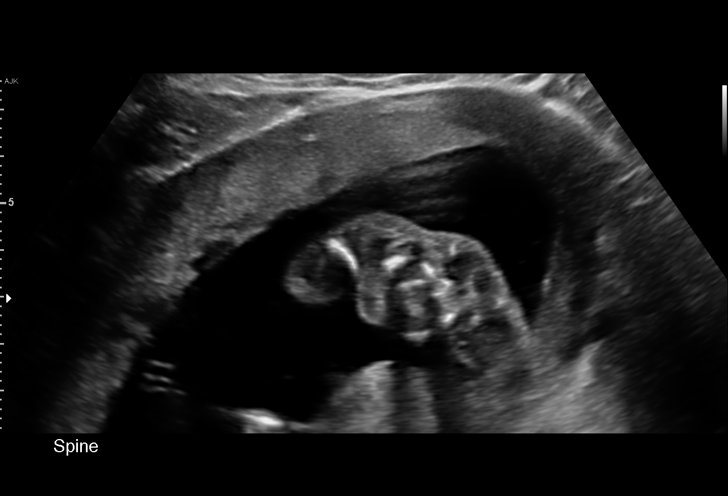
[im 90/106]
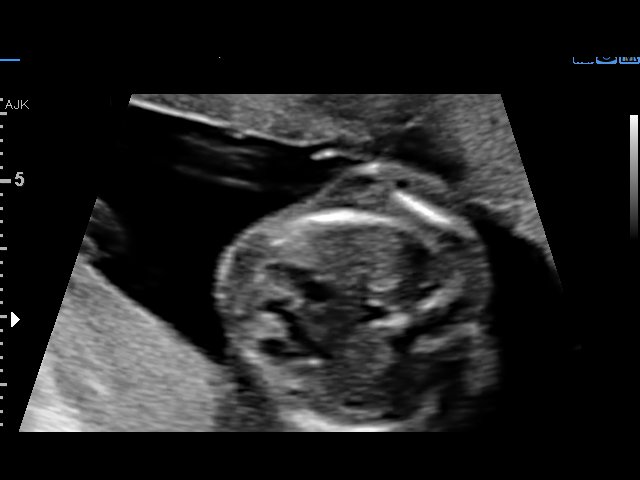
[im 98/106]
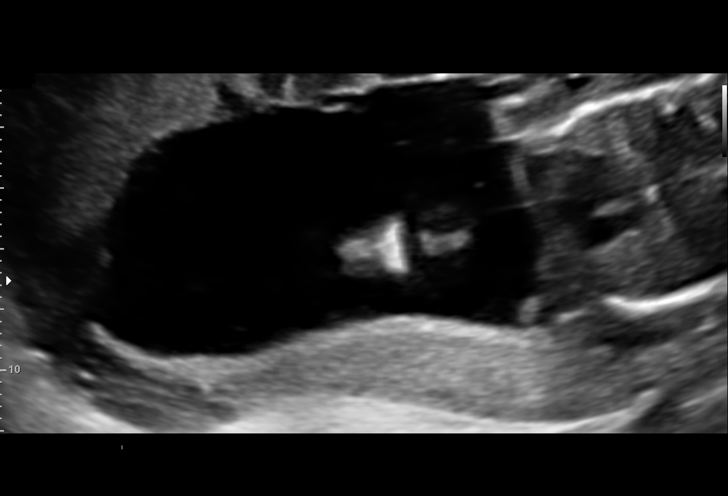
[im 106/106]
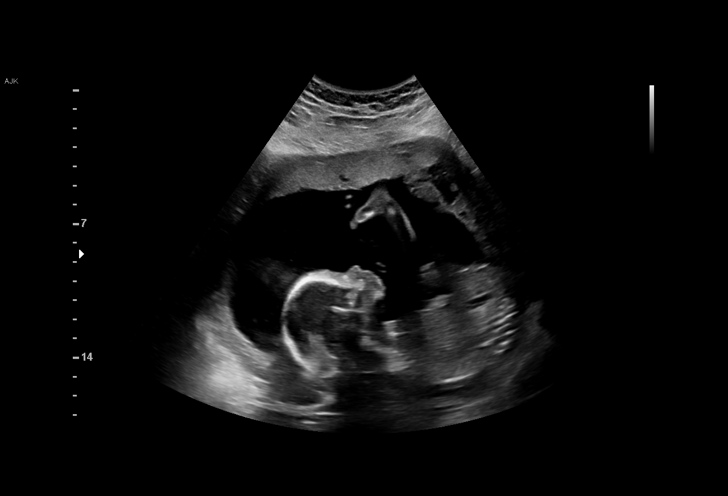

[14 of 28 positions shown; findings below may reference images not displayed]

ESCUDERO


1  SUB[employer] AKEHURST                [id number]      [id number]     [id number]
Indications

19 weeks gestation of pregnancy
Encounter for antenatal screening for
malformations
OB History

Blood Type:            Height:  5'5"   Weight (lb):  191       BMI:
Gravidity:    1
Fetal Evaluation

Num Of Fetuses:     1
Fetal Heart         150
Rate(bpm):
Cardiac Activity:   Observed
Presentation:       Cephalic
Placenta:           Anterior, above cervical os
P. Cord Insertion:  Visualized

Amniotic Fluid
AFI FV:      Subjectively within normal limits

Largest Pocket(cm)
3.89
Biometry

BPD:      42.7  mm     G. Age:  18w 6d         47  %    CI:         67.4   %    70 - 86
FL/HC:      16.6   %    16.1 -
HC:      166.5  mm     G. Age:  19w 2d         59  %    HC/AC:      1.15        1.09 -
AC:      144.5  mm     G. Age:  19w 5d         71  %    FL/BPD:     64.9   %
FL:       27.7  mm     G. Age:  18w 3d         25  %    FL/AC:      19.2   %    20 - 24
CER:      20.3  mm     G. Age:  19w 2d         57  %
NFT:       5.2  mm
CM:        4.3  mm

Est. FW:     278  gm    0 lb 10 oz      48  %
Gestational Age

LMP:           19w 0d        Date:  12/09/17                 EDD:   09/15/18
U/S Today:     19w 1d                                        EDD:   09/14/18
Best:          19w 0d     Det. By:  LMP  (12/09/17)          EDD:   09/15/18
Anatomy

Cranium:               Appears normal         Aortic Arch:            Appears normal
Cavum:                 Appears normal         Ductal Arch:            Not well visualized
Ventricles:            Appears normal         Diaphragm:              Appears normal
Choroid Plexus:        Appears normal         Stomach:                Appears normal, left
sided
Cerebellum:            Appears normal         Abdomen:                Appears normal
Posterior Fossa:       Appears normal         Abdominal Wall:         Appears nml (cord
insert, abd wall)
Nuchal Fold:           Appears normal         Cord Vessels:           Appears normal (3
vessel cord)
Face:                  Appears normal         Kidneys:                Appear normal
(orbits and profile)
Lips:                  Appears normal         Bladder:                Appears normal
Thoracic:              Appears normal         Spine:                  Appears normal
Heart:                 Appears normal         Upper Extremities:      Appears normal
(4CH, axis, and situs
RVOT:                  Appears normal         Lower Extremities:      Appears normal
LVOT:                  Appears normal

Other:  Fetus appears to be a male. Heels and RT 5th digit visualized.
Technically difficult due to fetal position.
Cervix Uterus Adnexa

Cervix
Length:            3.3  cm.
Normal appearance by transabdominal scan.

Uterus
No abnormality visualized.

Left Ovary
Not visualized.

Right Ovary
Within normal limits.

Adnexa:       No abnormality visualized.
Impression

SIUP at 19+0 weeks
Normal detailed fetal anatomy; limited views of RVOT
Markers of aneuploidy: none
Normal amniotic fluid volume
Measurements consistent with LMP dating
Recommendations

Follow-up as clinically indicated

## 2019-10-08 ENCOUNTER — Other Ambulatory Visit: Payer: Self-pay

## 2019-10-08 ENCOUNTER — Emergency Department (INDEPENDENT_AMBULATORY_CARE_PROVIDER_SITE_OTHER)
Admission: EM | Admit: 2019-10-08 | Discharge: 2019-10-08 | Disposition: A | Discharge status: Home / Self Care | Payer: No Typology Code available for payment source | Source: Home / Self Care | Attending: Family Medicine | Admitting: Family Medicine

## 2019-10-08 ENCOUNTER — Encounter: Payer: Self-pay | Admitting: Emergency Medicine

## 2019-10-08 DIAGNOSIS — R519 Headache, unspecified: Secondary | ICD-10-CM | POA: Diagnosis not present

## 2019-10-08 DIAGNOSIS — Z20828 Contact with and (suspected) exposure to other viral communicable diseases: Secondary | ICD-10-CM

## 2019-10-08 DIAGNOSIS — Z20822 Contact with and (suspected) exposure to covid-19: Secondary | ICD-10-CM

## 2019-10-08 DIAGNOSIS — J029 Acute pharyngitis, unspecified: Secondary | ICD-10-CM | POA: Diagnosis not present

## 2019-10-08 NOTE — ED Provider Notes (Signed)
Vinnie Langton C[facility]    CSN: [id number] Arrival date & time: 10/08/19  1549      History   Chief Complaint Chief Complaint  Patient presents with  . Sore Throat  . Headache    HPI [place] is a 23 y.o. female.   Patient had a negative COVID19 test six days ago.  Three days ago she was exposed to a COVID19 positive patient.  She has now developed a minimally sore throat and headache without other symptoms.  She denies chest tightness, shortness of breath, and changes in taste/smell.  She feels well otherwise.  The history is provided by the patient.    Past Medical History:  Diagnosis Date  . ADHD (attention deficit hyperactivity disorder)   . Bilateral ovarian cysts   . Headache     Patient Active Problem List   Diagnosis Date Noted  . Normal labor 09/14/2018  . Premature rupture of membranes 09/13/2018  . Group B Streptococcus carrier state affecting pregnancy 08/23/2018  . Supervision of normal first pregnancy, antepartum 03/31/2018  . Obesity in pregnancy 03/31/2018  . Low back pain 02/21/2015    History reviewed. No pertinent surgical history.  OB History    Gravida  1   Para  1   Term  1   Preterm      AB      Living  1     SAB      TAB      Ectopic      Multiple  0   Live Births  1            Home Medications    Prior to Admission medications   Medication Sig Start Date End Date Taking? Authorizing Provider  acetaminophen (TYLENOL) 325 MG tablet Take 2 tablets (650 mg total) by mouth every 4 (four) hours as needed (for pain scale < 4). 09/16/18   Myrtis Ser, CNM  ibuprofen (ADVIL,MOTRIN) 600 MG tablet Take 1 tablet (600 mg total) by mouth every 6 (six) hours. Patient not taking: Reported on 12/13/2018 09/16/18   Myrtis Ser, CNM  levonorgestrel [facility]) 20 MCG/24HR IUD 1 each by Intrauterine route once.    [provider]  Prenatal Vit-Fe Fumarate-FA (MULTIVITAMIN-PRENATAL) 27-0.8 MG TABS tablet  Take 1 tablet by mouth daily at 12 noon.    [provider]    Family History Family History  Problem Relation Age of Onset  . Hypertension Mother   . Thyroid disease Mother   . Depression Mother   . Hyperlipidemia Father   . ADD / ADHD Father   . Hypertension Father   . Thyroid disease Father   . Anxiety disorder Sister   . ADD / ADHD Sister   . ADD / ADHD Brother   . Depression Brother   . Stroke Maternal Grandfather   . Cancer Paternal Grandmother        brain  . Stroke Paternal Grandmother     Social History Social History   Tobacco Use  . Smoking status: Never Smoker  . Smokeless tobacco: Never Used  Substance Use Topics  . Alcohol use: Not Currently    Comment: Occasionally  . Drug use: No     Allergies   Patient has no known allergies.   Review of Systems Review of Systems + sore throat No cough No pleuritic pain No wheezing No nasal congestion No post-nasal drainage No sinus pain/pressure No itchy/red eyes No earache No hemoptysis No SOB  No fever/chills No nausea No vomiting No abdominal pain No diarrhea No urinary symptoms No skin rash No fatigue No myalgias + headache    Physical Exam Triage Vital Signs ED Triage Vitals  Enc Vitals Group     BP 10/08/19 1623 (!) 146/86     Pulse Rate 10/08/19 1623 84     Resp 10/08/19 1623 16     Temp 10/08/19 1623 98.6 F (37 C)     Temp Source 10/08/19 1623 Oral     SpO2 10/08/19 1623 98 %     Weight 10/08/19 1624 220 lb (99.8 kg)     Height 10/08/19 1624 5' 5"  (1.651 m)     Head Circumference --      Peak Flow --      Pain Score 10/08/19 1624 2     Pain Loc --      Pain Edu? --      Excl. in [place]? --    No data found.  Updated Vital Signs BP (!) 146/86 (BP Location: Right Arm)   Pulse 84   Temp 98.6 F (37 C) (Oral)   Resp 16   Ht 5' 5"  (1.651 m)   Wt 99.8 kg   SpO2 98%   BMI 36.61 kg/m   Visual Acuity Right Eye Distance:   Left Eye Distance:   Bilateral  Distance:    Right Eye Near:   Left Eye Near:    Bilateral Near:     Physical Exam Nursing notes and Vital Signs reviewed. Appearance:  Patient appears stated age, and in no acute distress Eyes:  Pupils are equal, round, and reactive to light and accomodation.  Extraocular movement is intact.  Conjunctivae are not inflamed  Ears:  Canals normal.  Tympanic membranes normal.  Nose:  Mildly congested turbinates.  No sinus tenderness. Pharynx:  Normal Neck:  Supple.  Enlarged lateral nodes are present, tender to palpation on the left.   Lungs:  Clear to auscultation.  Breath sounds are equal.  Moving air well. Heart:  Regular rate and rhythm without murmurs, rubs, or gallops.  Abdomen:  Nontender without masses or hepatosplenomegaly.  Bowel sounds are present.  No CVA or flank tenderness.  Extremities:  No edema.  Skin:  No rash present.   UC Treatments / Results  Labs (all labs ordered are listed, but only abnormal results are displayed) Labs Reviewed  SARS-COV-2 RNA,(COVID-19) QUALITATIVE NAAT    EKG   Radiology No results found.  Procedures Procedures (including critical care time)  Medications Ordered in UC Medications - No data to display  Initial Impression / Assessment and Plan / UC Course  I have reviewed the triage vital signs and the nursing notes.  Pertinent labs & imaging results that were available during my care of the patient were reviewed by me and considered in my medical decision making (see chart for details).    Benign exam.  Suspect early viral URI COVID19 send out  Final Clinical Impressions(s) / UC Diagnoses   Final diagnoses:  Exposure to COVID-19 virus     Discharge Instructions     If COVID-19 test is positive, isolate yourself until the below conditions are met: 1)  At least 7 days since symptoms onset. AND 2)  > 72 hours after symptom resolution (absence of fever without the use of fever-reducing medicine, and improvement in  respiratory symptoms.        ED Prescriptions    None  Kandra Nicolas, MD 10/11/19 1254

## 2019-10-08 NOTE — Discharge Instructions (Addendum)
If COVID-19 test is positive, isolate yourself until the below conditions are met: °1)  At least 7 days since symptoms onset. °AND °2)  > 72 hours after symptom resolution (absence of fever without the use of fever-reducing medicine, and improvement in respiratory symptoms. ° °   °

## 2019-10-08 NOTE — ED Triage Notes (Signed)
Patient tested negative for covid 6 days ago; was exposed to covid positive person 3 days ago; now has sore throat and headache  Has had influenza vacc this season.

## 2019-10-12 LAB — SARS-COV-2 RNA,(COVID-19) QUALITATIVE NAAT: SARS CoV2 RNA: NOT DETECTED

## 2020-07-06 IMAGING — CR RIGHT FOOT COMPLETE - 3+ VIEW
3 series · 3 of 3 positions shown · non-contrast
Comparison: Right foot series 04/19/2014.

CLINICAL DATA: 22-year-old female status post fall at work with
pain and swelling.

EXAM:
RIGHT FOOT COMPLETE - 3+ VIEW

[t foot ap right]
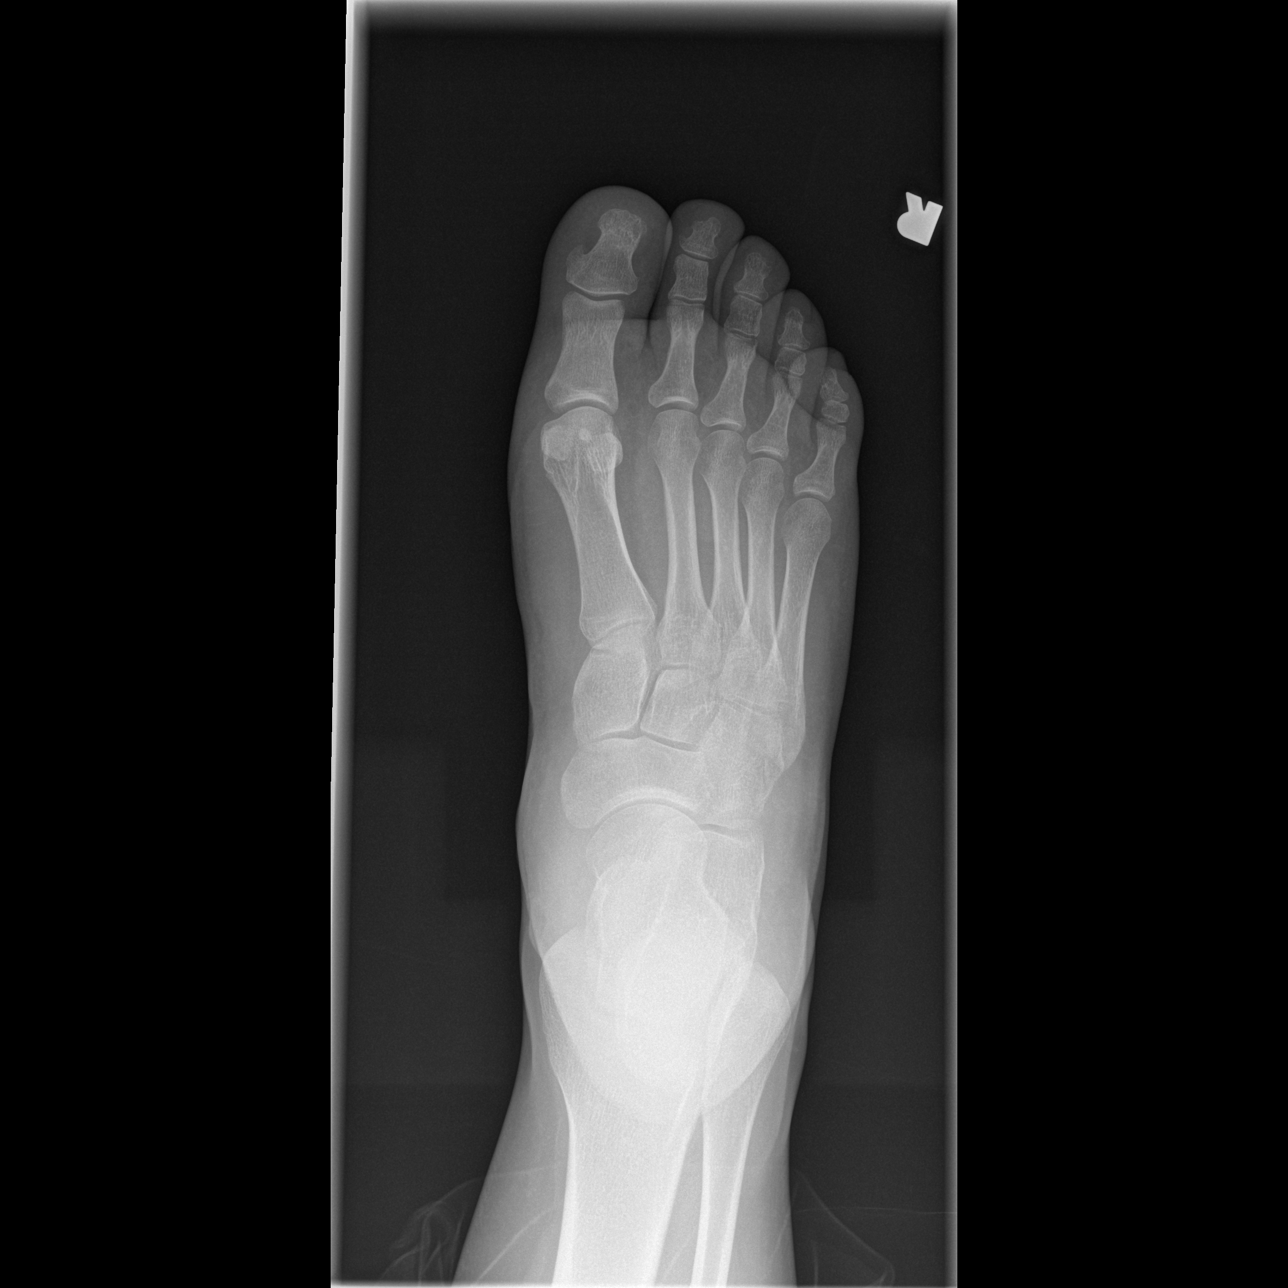

[t foot oblique right]
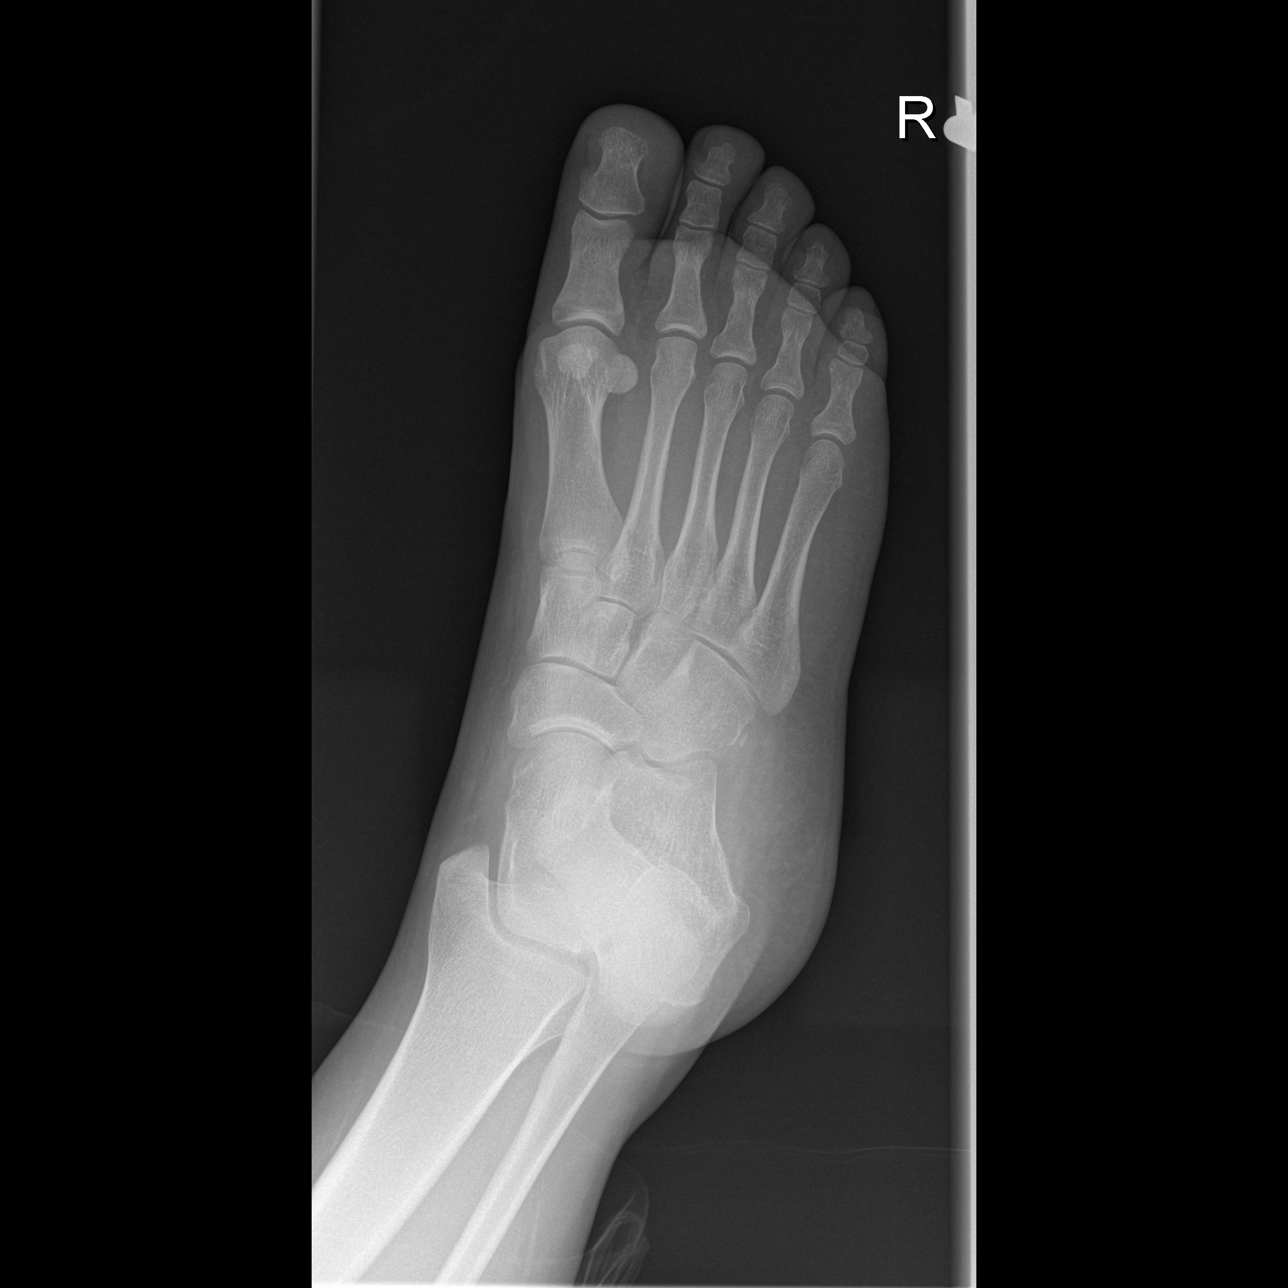

[t foot lat right]
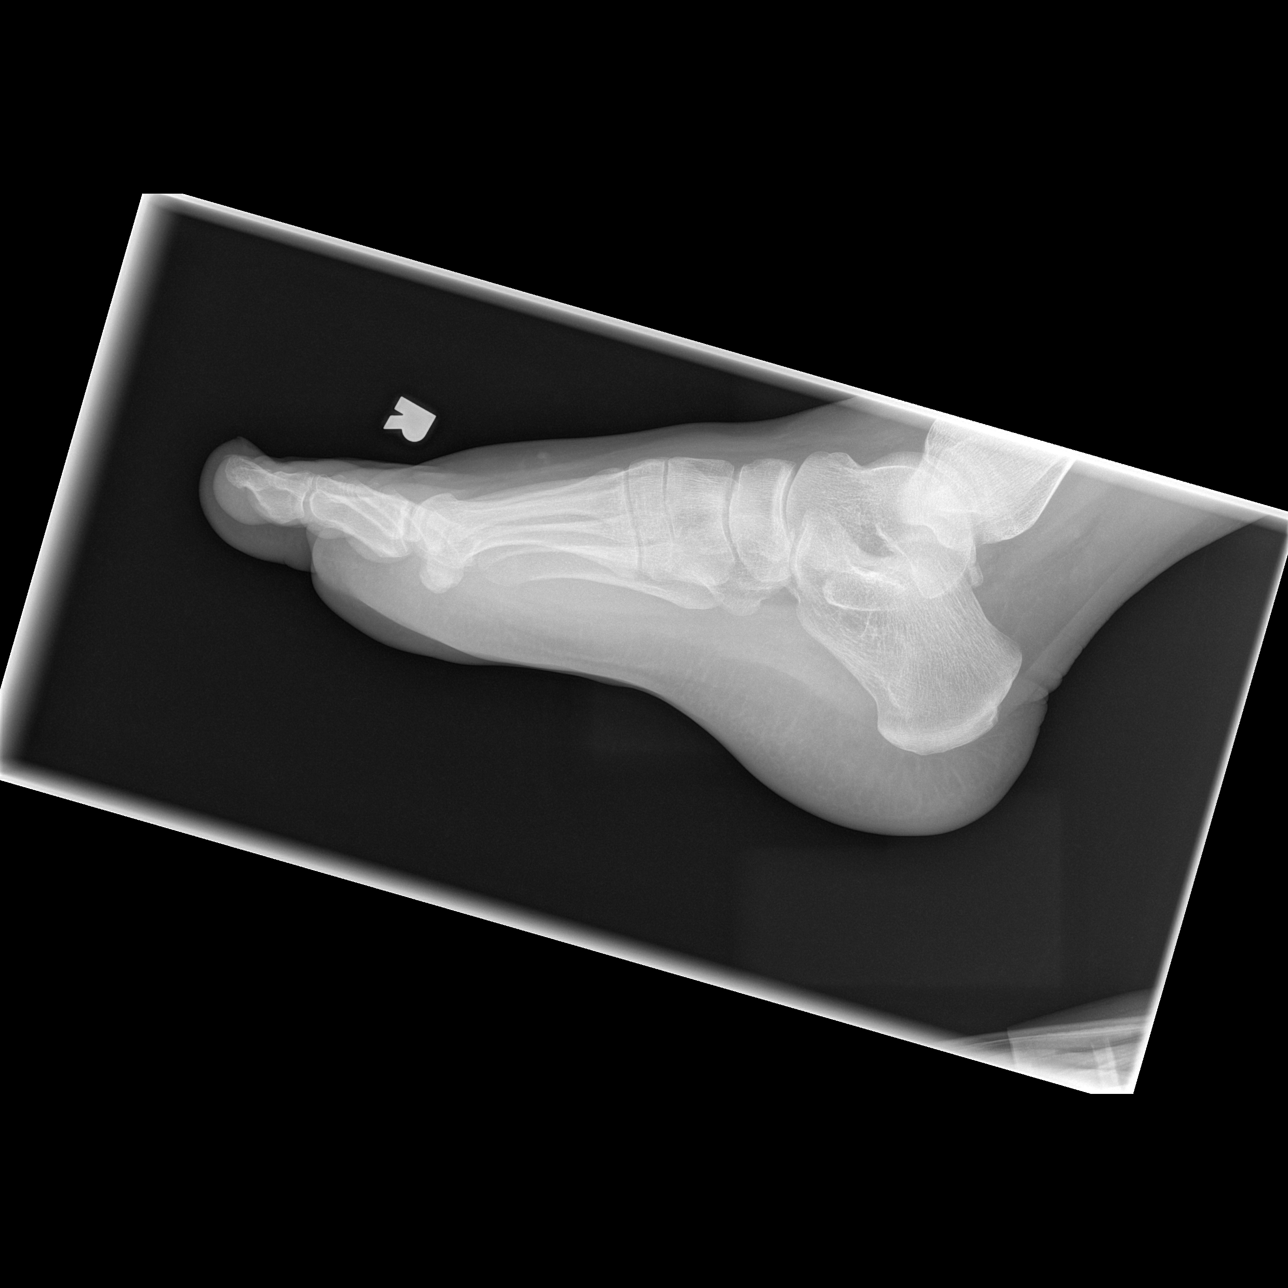

[3 of 3 positions shown; findings below may reference images not displayed]

FINDINGS: Bone mineralization is within normal limits. There is no evidence of
fracture or dislocation. Small accessory ossicle suspected lateral
to the cuboid. There is no evidence of arthropathy or other focal
bone abnormality. No discrete soft tissue injury.
IMPRESSION: No acute fracture or dislocation identified about the right foot.

## 2020-07-06 IMAGING — CR RIGHT ANKLE - COMPLETE 3+ VIEW
3 series · 3 of 3 positions shown · non-contrast
Comparison: Prior radiograph from 04/19/2014.

CLINICAL DATA: Initial evaluation for acute injury.  Swelling.

EXAM:
RIGHT ANKLE - COMPLETE 3+ VIEW

[t ankle joint ap right]
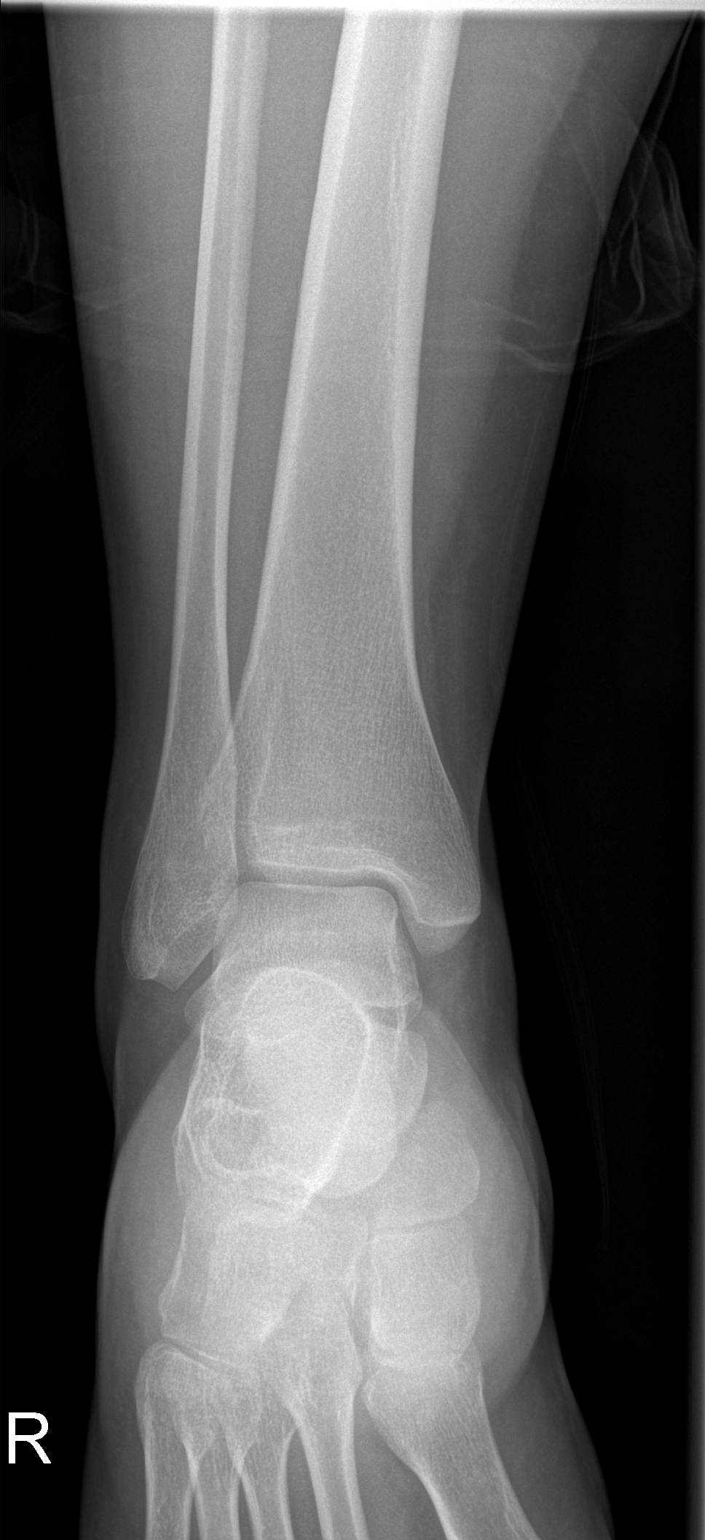

[t ankle joint oblique right]
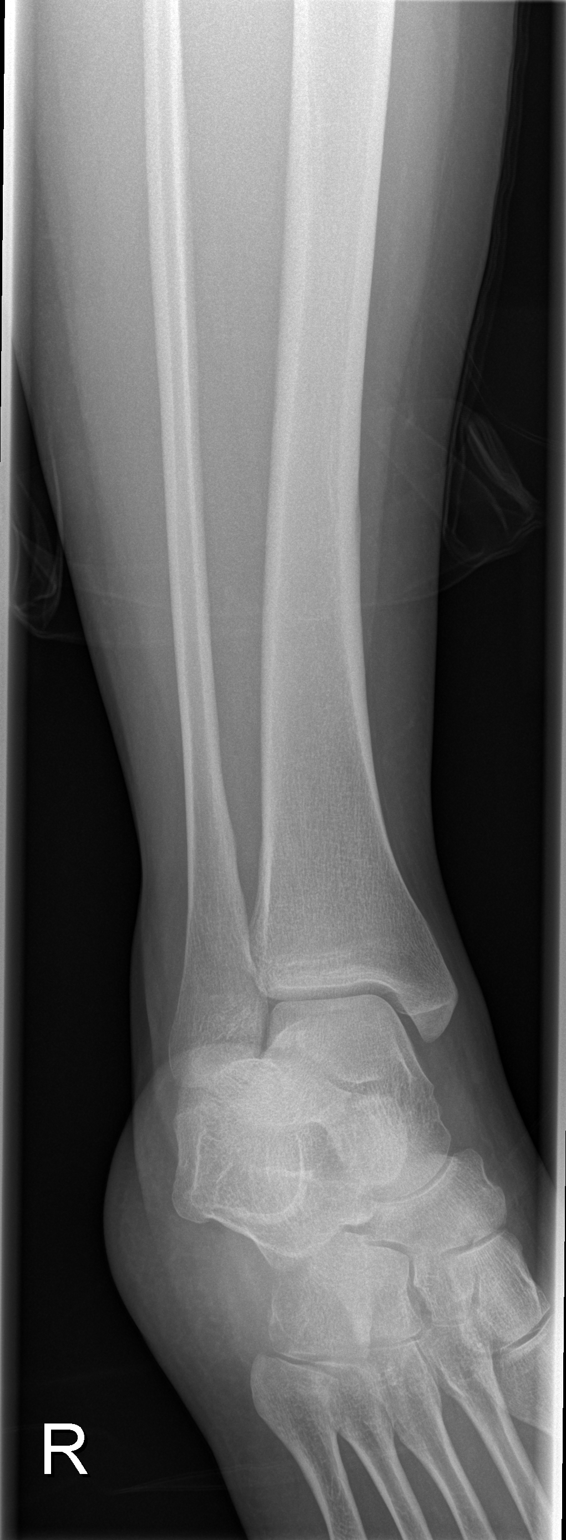

[t ankle joint lat right]
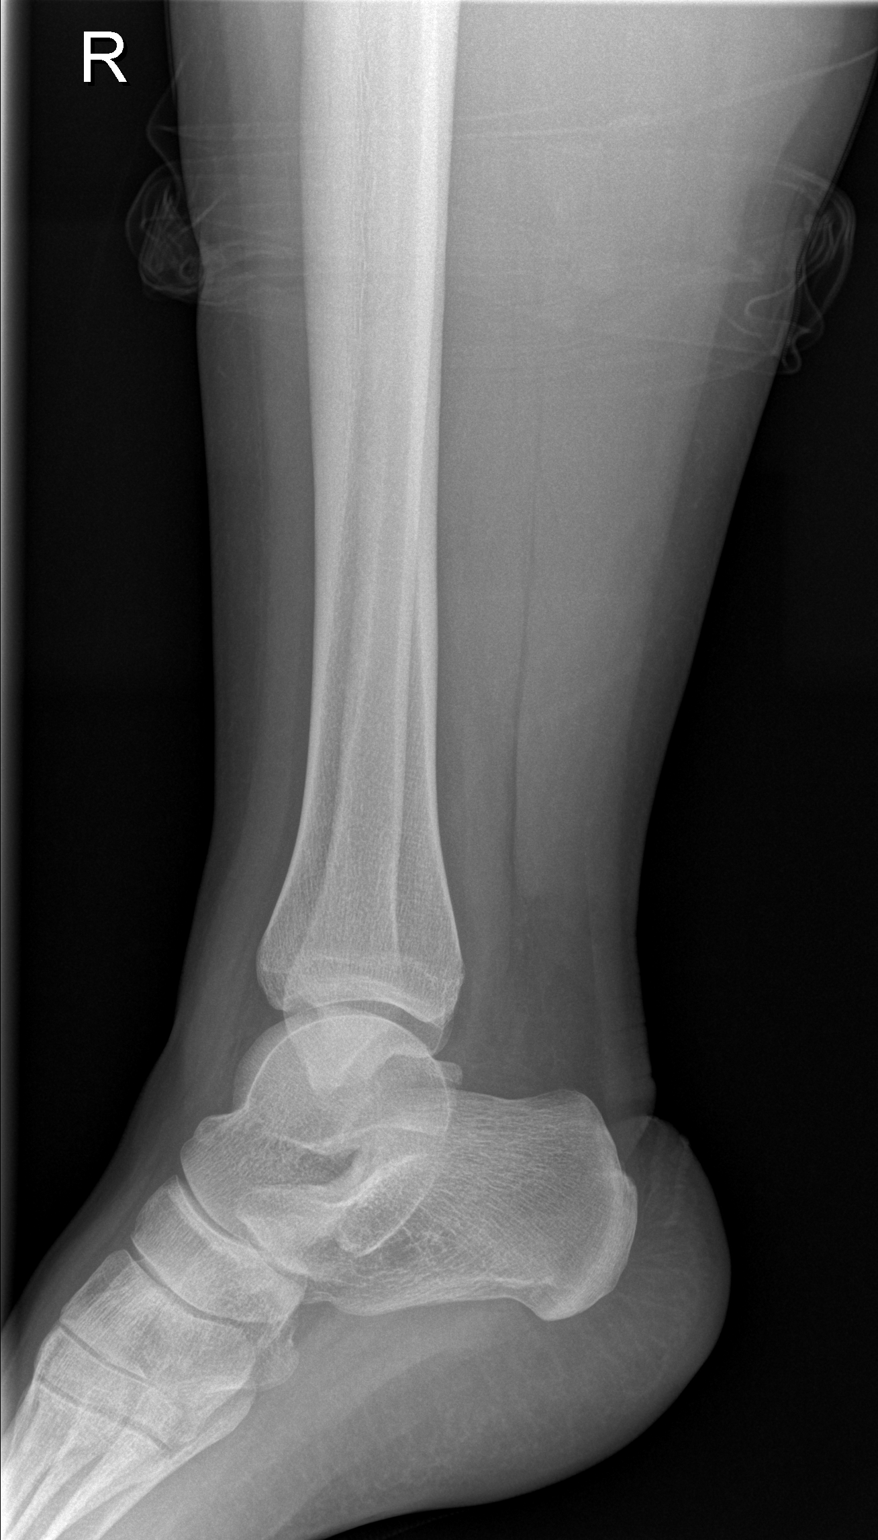

[3 of 3 positions shown; findings below may reference images not displayed]

FINDINGS: There is no evidence of fracture, dislocation, or joint effusion.
There is no evidence of arthropathy or other focal bone abnormality.
Mild diffuse soft tissue swelling about the ankle.
IMPRESSION: 1. No acute osseous abnormality.
2. Mild diffuse soft tissue swelling about the ankle.

## 2021-01-24 ENCOUNTER — Encounter: Payer: No Typology Code available for payment source | Admitting: Sports Medicine

## 2021-01-27 ENCOUNTER — Encounter: Payer: No Typology Code available for payment source | Admitting: Sports Medicine

## 2021-01-29 ENCOUNTER — Other Ambulatory Visit: Payer: Self-pay

## 2021-01-29 ENCOUNTER — Ambulatory Visit (INDEPENDENT_AMBULATORY_CARE_PROVIDER_SITE_OTHER): Payer: 59 | Admitting: Sports Medicine

## 2021-01-29 ENCOUNTER — Ambulatory Visit (INDEPENDENT_AMBULATORY_CARE_PROVIDER_SITE_OTHER): Payer: 59

## 2021-01-29 DIAGNOSIS — M654 Radial styloid tenosynovitis [de Quervain]: Secondary | ICD-10-CM

## 2021-01-29 MED ORDER — MELOXICAM 15 MG PO TABS: ORAL_TABLET | ORAL | 3 refills | Status: DC

## 2021-01-29 NOTE — Assessment & Plan Note (Signed)
This pleasant 25 year old female has had about a month of right wrist pain, localized over the first extensor compartment, she does repetitive motions at work, she went kayaking and has been working out, all contributed. She does have some pain with terminal dorsiflexion and volar flexion of her wrist but the dominant finding is a positive Finkelstein sign. Adding a thumb spica brace, switching to meloxicam, getting x-rays, home rehab exercises, return to see me in a month, injection if no better.

## 2021-01-29 NOTE — Progress Notes (Signed)
    Procedures performed today:    None.  Independent interpretation of notes and tests performed by another Alwaleed Obeso:   None.  Brief History, Exam, Impression, and Recommendations:    De Quervain's tenosynovitis, right This pleasant 25 year old female has had about a month of right wrist pain, localized over the first extensor compartment, she does repetitive motions at work, she went kayaking and has been working out, all contributed. She does have some pain with terminal dorsiflexion and volar flexion of her wrist but the dominant finding is a positive Finkelstein sign. Adding a thumb spica brace, switching to meloxicam, getting x-rays, home rehab exercises, return to see me in a month, injection if no better.    ___________________________________________ Ihor Austin. Benjamin Stain, M.D., ABFM., CAQSM. Primary Care and Sports Medicine [facility]  Adjunct Instructor of Family Medicine  [employer]

## 2021-02-28 ENCOUNTER — Other Ambulatory Visit: Payer: Self-pay

## 2021-02-28 ENCOUNTER — Ambulatory Visit (INDEPENDENT_AMBULATORY_CARE_PROVIDER_SITE_OTHER): Payer: 59 | Admitting: Sports Medicine

## 2021-02-28 DIAGNOSIS — M654 Radial styloid tenosynovitis [de Quervain]: Secondary | ICD-10-CM | POA: Diagnosis not present

## 2021-02-28 NOTE — Assessment & Plan Note (Signed)
This pleasant 25 year old female has been wearing her brace, doing her rehab exercises and returns today with symptoms resolved. Discontinue brace, return as needed. She is going to establish primary care with one of my partners.

## 2021-02-28 NOTE — Progress Notes (Signed)
    Procedures performed today:    None.  Independent interpretation of notes and tests performed by another provider:   None.  Brief History, Exam, Impression, and Recommendations:    De Quervain's tenosynovitis, right This pleasant 25 year old female has been wearing her brace, doing her rehab exercises and returns today with symptoms resolved. Discontinue brace, return as needed. She is going to establish primary care with one of my partners.    ___________________________________________ Ihor Austin. Benjamin Stain, M.D., ABFM., CAQSM. Primary Care and Sports Medicine [facility]  Adjunct Instructor of Family Medicine  [employer]

## 2021-03-14 ENCOUNTER — Ambulatory Visit: Payer: 59 | Admitting: Medical-Surgical

## 2021-03-28 ENCOUNTER — Other Ambulatory Visit: Payer: Self-pay

## 2021-03-28 ENCOUNTER — Ambulatory Visit (INDEPENDENT_AMBULATORY_CARE_PROVIDER_SITE_OTHER): Payer: 59 | Admitting: Medical-Surgical

## 2021-03-28 ENCOUNTER — Encounter: Payer: Self-pay | Admitting: Medical-Surgical

## 2021-03-28 VITALS — BP 122/82 | HR 85 | Temp 99.2°F | Ht 65.0 in | Wt 238.4 lb

## 2021-03-28 DIAGNOSIS — Z7689 Persons encountering health services in other specified circumstances: Secondary | ICD-10-CM

## 2021-03-28 DIAGNOSIS — G43709 Chronic migraine without aura, not intractable, without status migrainosus: Secondary | ICD-10-CM | POA: Diagnosis not present

## 2021-03-28 DIAGNOSIS — Z8349 Family history of other endocrine, nutritional and metabolic diseases: Secondary | ICD-10-CM

## 2021-03-28 DIAGNOSIS — F419 Anxiety disorder, unspecified: Secondary | ICD-10-CM

## 2021-03-28 DIAGNOSIS — F900 Attention-deficit hyperactivity disorder, predominantly inattentive type: Secondary | ICD-10-CM

## 2021-03-28 DIAGNOSIS — Z0000 Encounter for general adult medical examination without abnormal findings: Secondary | ICD-10-CM

## 2021-03-28 DIAGNOSIS — Z6839 Body mass index (BMI) 39.0-39.9, adult: Secondary | ICD-10-CM

## 2021-03-28 MED ORDER — LISDEXAMFETAMINE DIMESYLATE 30 MG PO CAPS: 30.0000 mg | ORAL_CAPSULE | Freq: Every day | ORAL | 0 refills | Status: DC

## 2021-03-28 MED ORDER — ESCITALOPRAM OXALATE 10 MG PO TABS: ORAL_TABLET | ORAL | 1 refills | Status: DC

## 2021-03-28 MED ORDER — ESCITALOPRAM OXALATE 10 MG PO TABS: 10.0000 mg | ORAL_TABLET | Freq: Every day | ORAL | 1 refills | Status: DC
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
## 2021-03-28 NOTE — Patient Instructions (Addendum)
Weight loss tips and tricks:  1.  Make sure to drink at least 64 ounces of water every day. 2.  Avoid alcohol. 3.  Avoid eating within 3 hours of going to bed. 4.  Cut out sugary drinks such as sweet tea, regular sodas, energy drinks, etc. 5.  Make sure you are getting enough sleep every night. 6.  Start making changes by cutting back portion sizes by 1/3. 7.  Keep a food diary to help identify areas for improvement and promote awareness of bad habits. 8.  Increase your activity.  Choose something you like to do that is fun for you so you are more likely to stick with it. 9.  Do not forget your protein! 10.  Measure your neck, upper arms, waist, hips, and thighs and write those measurements down somewhere before you start your weight loss journey.  Changes in your measurements will tell a far more accurate story than the number on a scale. 11.  As you go, pay attention to how your clothes fit! 12.  Weigh yourself as often or as little as you need to.  Some folks do better weighing every day while others do better with once a week. 13.  Do not get discouraged!  Weight loss efforts are meant to be lifestyle changes.  Once you stop a medication (if you are taking 1), your behaviors and habits will determine if you maintain your weight loss or not.  For those on medications:  1.  Take your medication as prescribed every day first thing in the morning. 2.  Common side effects include nausea and constipation.  To combat this, increased your daily water consumption.  Consider adding in a stool softener (available OTC) if needed.  Also increase fiber intake with vegetables and fruits. 3.  If you have any side effects or concerns while taking medication, please do not hesitate to reach out to [place] here at the office. 4.  While on weight loss medications, we do require you to follow-up every 4 weeks with an in office visit.  Refills will not be called in early on controlled substances.  Good luck on  your weight loss journey!  Have faith in your self and you will reach your goals!    Reasons why it is important to allow yourself to process and experience true feelings: 1. When you numb sadness, you also numb happiness and Kara Cole. 2. Struggling with your emotions often leads to more suffering. 3. Processing and experiencing your feelings is a part of having a full life.    Coping skills are things we can do to make ourselves feel better when we are going through difficult times. Examples of coping skills include:  . Take a deep breath . Count to 20 . Listen to music . Call a friend . Take a walk . Read a book . Do a puzzle . Meditate . Journal . Exercise . Stretch . Sing . Bake . Knit . Go outside . Garden . Pray . Color . Send a note . Take a bath . Watch a movie . Pet an animal . Visit a friend . Be alone in a quiet place   When your anxiety gets worse, try these grounding techniques:      If you need additional help, please contact your primary care provider or call one of the resources listed below:  [facility] Behavioral Help  24-hour HelpLine at (902)598-0277 or (432)351-5052 [address] [place], [place] [address]  [employer] 800-SUICIDE or 639-171-8988  The National Suicide Prevention Lifeline 800-273-TALK or (336)725-3615  [facility] [email or web address]://www.[email or web address]/

## 2021-03-29 LAB — COMPLETE METABOLIC PANEL WITH GFR
AG Ratio: 1.6 (calc) (ref 1.0–2.5)
ALT: 44 U/L — ABNORMAL HIGH (ref 6–29)
AST: 21 U/L (ref 10–30)
Albumin: 4.9 g/dL (ref 3.6–5.1)
Alkaline phosphatase (APISO): 74 U/L (ref 31–125)
BUN: 17 mg/dL (ref 7–25)
CO2: 26 mmol/L (ref 20–32)
Calcium: 10.2 mg/dL (ref 8.6–10.2)
Chloride: 104 mmol/L (ref 98–110)
Creat: 0.73 mg/dL (ref 0.50–1.10)
GFR, Est African American: 133 mL/min/{1.73_m2} (ref >=60)
GFR, Est Non African American: 114 mL/min/{1.73_m2} (ref >=60)
Globulin: 3 g/dL (calc) (ref 1.9–3.7)
Glucose, Bld: 87 mg/dL (ref 65–99)
Potassium: 4.2 mmol/L (ref 3.5–5.3)
Sodium: 138 mmol/L (ref 135–146)
Total Bilirubin: 0.4 mg/dL (ref 0.2–1.2)
Total Protein: 7.9 g/dL (ref 6.1–8.1)

## 2021-03-29 LAB — CBC WITH DIFFERENTIAL/PLATELET
Absolute Monocytes: 482 cells/uL (ref 200–950)
Basophils Absolute: 20 cells/uL (ref 0–200)
Basophils Relative: 0.3 %
Eosinophils Absolute: 34 cells/uL (ref 15–500)
Eosinophils Relative: 0.5 %
HCT: 40.3 % (ref 35.0–45.0)
Hemoglobin: 13.2 g/dL (ref 11.7–15.5)
Lymphs Abs: 2044 cells/uL (ref 850–3900)
MCH: 29.5 pg (ref 27.0–33.0)
MCHC: 32.8 g/dL (ref 32.0–36.0)
MCV: 90 fL (ref 80.0–100.0)
MPV: 12 fL (ref 7.5–12.5)
Monocytes Relative: 7.2 %
Neutro Abs: 4121 cells/uL (ref 1500–7800)
Neutrophils Relative %: 61.5 %
Platelets: 206 10*3/uL (ref 140–400)
RBC: 4.48 10*6/uL (ref 3.80–5.10)
RDW: 11.6 % (ref 11.0–15.0)
Total Lymphocyte: 30.5 %
WBC: 6.7 10*3/uL (ref 3.8–10.8)

## 2021-03-29 LAB — LIPID PANEL
Cholesterol: 189 mg/dL (ref <200)
HDL: 48 mg/dL — ABNORMAL LOW (ref >=50)
LDL Cholesterol (Calc): 122 mg/dL (calc) — ABNORMAL HIGH
Non-HDL Cholesterol (Calc): 141 mg/dL (calc) — ABNORMAL HIGH (ref <130)
Total CHOL/HDL Ratio: 3.9 (calc) (ref <5.0)
Triglycerides: 90 mg/dL (ref <150)

## 2021-03-29 LAB — TSH: TSH: 2.62 mIU/L

## 2021-04-25 ENCOUNTER — Encounter: Payer: Self-pay | Admitting: Medical-Surgical

## 2021-04-25 ENCOUNTER — Telehealth (INDEPENDENT_AMBULATORY_CARE_PROVIDER_SITE_OTHER): Payer: 59 | Admitting: Medical-Surgical

## 2021-04-25 DIAGNOSIS — F900 Attention-deficit hyperactivity disorder, predominantly inattentive type: Secondary | ICD-10-CM

## 2021-04-25 DIAGNOSIS — F419 Anxiety disorder, unspecified: Secondary | ICD-10-CM | POA: Diagnosis not present

## 2021-04-25 MED ORDER — ESCITALOPRAM OXALATE 10 MG PO TABS: 10.0000 mg | ORAL_TABLET | Freq: Every day | ORAL | 1 refills | Status: DC

## 2021-04-25 MED ORDER — LISDEXAMFETAMINE DIMESYLATE 30 MG PO CAPS: 30.0000 mg | ORAL_CAPSULE | Freq: Every day | ORAL | 0 refills | Status: DC

## 2021-04-25 NOTE — Progress Notes (Signed)
Virtual Visit via Video Note  I connected with Kara Cole on 04/25/21 at  1:20 PM EDT by a video enabled telemedicine application and verified that I am speaking with the correct person using two identifiers.   I discussed the limitations of evaluation and management by telemedicine and the availability of in person appointments. The patient expressed understanding and agreed to proceed.  Patient location: home Provider locations: office  Subjective:    CC: mood/ADHD follow up  HPI: Pleasant 25 year old female presenting via MyChart video visit for mood and ADHD follow-up.  She has been taking Vyvanse 30 mg daily as prescribed, tolerating well without concerning side effects.  She is also taking Lexapro 10 mg daily, tolerating this well.  Notes that her mood has somewhat improved and she is not as anxious as she was.  Feels the Vyvanse has been helpful for attention but has also caused significant appetite suppression.  Feels that she is lost approximately 10 pounds so far.  At times she does have such significant loss of appetite that she has some nausea at without a food.  She does note if she sits down with the food in front of her and starts to eat, she is able to eat regularly.  Denies SI/HI.  Past medical history, Surgical history, Family history not pertinant except as noted below, Social history, Allergies, and medications have been entered into the medical record, reviewed, and corrections made.   Review of Systems: See HPI for pertinent positives and negatives.   Depression screen [facility] 2/9 04/25/2021 03/28/2021  Decreased Interest 0 1  Down, Depressed, Hopeless 0 0  PHQ - 2 Score 0 1  Altered sleeping 1 0  Tired, decreased energy 3 2  Change in appetite 3 1  Feeling bad or failure about yourself  0 2  Trouble concentrating 0 2  Moving slowly or fidgety/restless 0 1  Suicidal thoughts 0 0  PHQ-9 Score 7 9  Difficult doing work/chores Somewhat difficult Somewhat  difficult   GAD 7 : Generalized Anxiety Score 04/25/2021 03/28/2021  Nervous, Anxious, on Edge 1 3  Control/stop worrying 1 2  Worry too much - different things 1 1  Trouble relaxing 0 1  Restless 0 1  Easily annoyed or irritable 1 1  Afraid - awful might happen 0 1  Total GAD 7 Score 4 10  Anxiety Difficulty Somewhat difficult Somewhat difficult     Objective:    General: Speaking clearly in complete sentences without any shortness of breath.  Alert and oriented x3.  Normal judgment. No apparent acute distress.  Impression and Recommendations:    1. Anxiety Continue Lexapro 10 mg daily.  She would like to give this more time at this dose before deciding whether to make a change or not.  Recommend monitoring symptoms and if they continue to be bothersome or not well controlled, consider increase Lexapro to 20 mg daily or add in a small dose of Wellbutrin.  2. Attention deficit hyperactivity disorder (ADHD), predominantly inattentive type Continue Vyvanse 30 mg daily.  Monitor weight and appetite.  If appetite suppression worsens, return for further evaluation and possible change in dose.  I discussed the assessment and treatment plan with the patient. The patient was provided an opportunity to ask questions and all were answered. The patient agreed with the plan and demonstrated an understanding of the instructions.   The patient was advised to call back or seek an in-person evaluation if the symptoms worsen or if the  condition fails to improve as anticipated.  20 minutes of non-face-to-face time was provided during this encounter.  Return in about 3 months (around 07/26/2021) for ADHD/mood follow-up.  Thayer Ohm, DNP, APRN, FNP-BC [facility]

## 2021-06-11 ENCOUNTER — Ambulatory Visit (HOSPITAL_COMMUNITY): Payer: Self-pay | Admitting: Licensed Clinical Social Worker

## 2021-07-08 ENCOUNTER — Ambulatory Visit (HOSPITAL_COMMUNITY): Payer: Self-pay | Admitting: Licensed Clinical Social Worker

## 2021-07-09 ENCOUNTER — Ambulatory Visit: Payer: 59 | Admitting: Neurology

## 2021-07-09 ENCOUNTER — Encounter: Payer: Self-pay | Admitting: Neurology
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
## 2021-08-27 ENCOUNTER — Encounter: Payer: Self-pay | Admitting: Medical-Surgical

## 2021-08-27 ENCOUNTER — Telehealth (INDEPENDENT_AMBULATORY_CARE_PROVIDER_SITE_OTHER): Payer: 59 | Admitting: Medical-Surgical

## 2021-08-27 DIAGNOSIS — F900 Attention-deficit hyperactivity disorder, predominantly inattentive type: Secondary | ICD-10-CM | POA: Diagnosis not present

## 2021-08-27 DIAGNOSIS — F419 Anxiety disorder, unspecified: Secondary | ICD-10-CM

## 2021-08-27 MED ORDER — SERTRALINE HCL 50 MG PO TABS: 50.0000 mg | ORAL_TABLET | Freq: Every day | ORAL | 3 refills | Status: DC

## 2021-08-27 MED ORDER — METHYLPHENIDATE HCL ER (OSM) 18 MG PO TBCR: 18.0000 mg | EXTENDED_RELEASE_TABLET | Freq: Every day | ORAL | 0 refills | Status: DC

## 2021-08-27 NOTE — Progress Notes (Signed)
Virtual Visit via Video Note  I connected with [place] on 08/27/21 at  3:00 PM EDT by a video enabled telemedicine application and verified that I am speaking with the correct person using two identifiers.   I discussed the limitations of evaluation and management by telemedicine and the availability of in person appointments. The patient expressed understanding and agreed to proceed.  Patient location: home Provider locations: office  Subjective:    CC: ADHD and mood follow up  HPI: Pleasant 25 year old female presenting today via MyChart video visit for the following:  ADHD- unfortunately, Vyvanse is not covered by her insurance and the cost out of pocket is not financially feasible. Did not tolerate Adderall. Interested in other options to treat inattention.   Mood- doing well on Lexapro 10mg  daily. She and her husband are considering trying for a second child and she wants to know what medications she will be able to take and what she should stop. Is okay with changing medicines if needed.   Past medical history, Surgical history, Family history not pertinant except as noted below, Social history, Allergies, and medications have been entered into the medical record, reviewed, and corrections made.   Review of Systems: See HPI for pertinent positives and negatives.   Objective:    General: Speaking clearly in complete sentences without any shortness of breath.  Alert and oriented x3.  Normal judgment. No apparent acute distress.  Impression and Recommendations:    1. Attention deficit hyperactivity disorder (ADHD), predominantly inattentive type Discontinue Vyvanse. Trialing Concerta CR 18mg  daily. Advised that this will need to be discontinued once she begins trying to conceive.   2. Anxiety Reviewed current recommendations for management of mental health concerns in women who are trying to conceive or pregnant. She would likely be able to stay on Lexapro but  would like to switch to Zoloft since this is often preferred by OB providers. Discontinue Lexapro. Start Zoloft 50mg  daily.   I discussed the assessment and treatment plan with the patient. The patient was provided an opportunity to ask questions and all were answered. The patient agreed with the plan and demonstrated an understanding of the instructions.   The patient was advised to call back or seek an in-person evaluation if the symptoms worsen or if the condition fails to improve as anticipated.  25 minutes of non-face-to-face time was provided during this encounter.  Return in about 4 weeks (around 09/24/2021) for mood follow up.  , DNP, APRN, FNP-BC [facility] and [facility]

## 2021-08-27 NOTE — Progress Notes (Signed)
Charity fundraiser, needs an alternative Doing ok on Lexapro, thinking about coming off, wants to talk about options.  Her and husband want to conceive.

## 2021-09-26 ENCOUNTER — Telehealth (INDEPENDENT_AMBULATORY_CARE_PROVIDER_SITE_OTHER): Payer: 59 | Admitting: Medical-Surgical

## 2021-09-26 ENCOUNTER — Encounter: Payer: Self-pay | Admitting: Medical-Surgical

## 2021-09-26 DIAGNOSIS — F419 Anxiety disorder, unspecified: Secondary | ICD-10-CM | POA: Diagnosis not present

## 2021-09-26 DIAGNOSIS — F900 Attention-deficit hyperactivity disorder, predominantly inattentive type: Secondary | ICD-10-CM

## 2021-09-26 NOTE — Progress Notes (Signed)
Virtual Visit via Video Note  I connected with [place] on 09/26/21 at  2:40 PM EST by a video enabled telemedicine application and verified that I am speaking with the correct person using two identifiers.   I discussed the limitations of evaluation and management by telemedicine and the availability of in person appointments. The patient expressed understanding and agreed to proceed.  Patient location: home Provider locations: office  Subjective:    CC: mood follow up  HPI: Pleasant 25 year old female presenting via MyChart video visit for mood follow-up.  Since our last visit, she has decided to stop all of her medications including the Concerta and Zoloft.  She noted that she was taking the Zoloft and feeling a little strange although not bad.  Unfortunately, with alcohol intake she got very drunk very quickly while taking the Zoloft.  She did not like this and decided to go to half dose for a short period before stopping it.  She did not suffer any ill side effects and feels like overall her mood is doing well.  Notes that she is getting back to her sassy self.  Has decided to stop the Concerta.  She will be going to her OB/GYN for IUD removal and she and her significant other are going to be trying for a baby.  Since she cannot take this medication while pregnant, she is decided that she will just stay off of it for now.  Past medical history, Surgical history, Family history not pertinant except as noted below, Social history, Allergies, and medications have been entered into the medical record, reviewed, and corrections made.   Review of Systems: See HPI for pertinent positives and negatives.   Objective:    General: Speaking clearly in complete sentences without any shortness of breath.  Alert and oriented x3.  Normal judgment. No apparent acute distress.  Impression and Recommendations:    1. Attention deficit hyperactivity disorder (ADHD), predominantly  inattentive type We will hold off on any kind of medication at this point since she is planning to try to conceive.  She knows that she is always welcome to reach out should she decide to get restarted on something.  2. Anxiety Discontinue Zoloft.  For now, her mood is fairly stable so she will let me know if she decides she would like to try something different in the future.   I discussed the assessment and treatment plan with the patient. The patient was provided an opportunity to ask questions and all were answered. The patient agreed with the plan and demonstrated an understanding of the instructions.   The patient was advised to call back or seek an in-person evaluation if the symptoms worsen or if the condition fails to improve as anticipated.  15 minutes of non-face-to-face time was provided during this encounter.  Return if symptoms worsen or fail to improve.  Thayer Ohm, DNP, APRN, FNP-BC [facility]

## 2021-09-26 NOTE — Progress Notes (Signed)
Stopped taking all medications. 

## 2021-10-10 ENCOUNTER — Ambulatory Visit: Payer: 59 | Admitting: Certified Nurse Midwife

## 2021-10-16 ENCOUNTER — Ambulatory Visit: Payer: 59 | Admitting: Obstetrics and Gynecology

## 2021-10-28 ENCOUNTER — Other Ambulatory Visit: Payer: Self-pay

## 2021-10-28 ENCOUNTER — Ambulatory Visit (INDEPENDENT_AMBULATORY_CARE_PROVIDER_SITE_OTHER): Payer: 59

## 2021-10-28 ENCOUNTER — Other Ambulatory Visit (HOSPITAL_COMMUNITY)
Admission: RE | Admit: 2021-10-28 | Discharge: 2021-10-28 | Disposition: A | Discharge status: Home / Self Care | Payer: 59 | Source: Ambulatory Visit

## 2021-10-28 VITALS — BP 126/75 | HR 97 | Resp 16 | Ht 65.0 in | Wt 241.0 lb

## 2021-10-28 DIAGNOSIS — Z01419 Encounter for gynecological examination (general) (routine) without abnormal findings: Secondary | ICD-10-CM

## 2021-10-28 NOTE — Progress Notes (Signed)
a  Subjective:     Kara Cole is a 25 y.o. female here at [facility] for a routine exam.  Current complaints: none. Desires Mirena IUD removal. Wants to conceive.   Do you have a primary care provider? yes Do you feel safe at home? yes  Flowsheet Row Video Visit from 09/26/2021 in [facility]  PHQ-2 Total Score 0       Health Maintenance Due  Topic Date Due   COVID-19 Vaccine (3 - Booster for Pfizer series) 09/14/2020   PAP SMEAR-Modifier  03/31/2021     Risk factors for chronic health problems: Smoking: no Alchohol/how much: occasional Illict drug use: no Exercise: no Pt BMI: Body mass index is 40.1 kg/m.   Gynecologic History No LMP recorded. (Menstrual status: IUD). Contraception: IUD Sexual health: no issues Last Pap: 03/21/2018. Results were: normal Last mammogram: n/a d/t age  Obstetric History OB History  Gravida Para Term Preterm AB Living  1 1 1     1   SAB IAB Ectopic Multiple Live Births        0 1    # Outcome Date GA Lbr Len/2nd Weight Sex Delivery Anes PTL Lv  1 Term 09/14/18 102w6d 35:16 / 02:05 7 lb 4 oz (3.289 kg) M Vag-Spont EPI  LIV    The following portions of the patient's history were reviewed and updated as appropriate: allergies, current medications, past family history, past medical history, past social history, past surgical history, and problem list.  Review of Systems Pertinent items are noted in HPI.    Objective:   BP 126/75    Pulse 97    Resp 16    Ht 5\' 5"  (1.651 m)    Wt 241 lb (109.3 kg)    Breastfeeding No    BMI 40.10 kg/m  VS reviewed, nursing note reviewed,  Constitutional: well developed, well nourished, no distress HEENT: normocephalic CV: normal rate Pulm/chest wall: normal effort Breast Exam:  Deferred with low risks and shared decision making, discussed recommendation to start mammogram between 40-50 yo/ exam  Abdomen: soft Neuro: alert and oriented x 3 Skin: warm,  dry Psych: affect normal Pelvic exam: Exam difficult d/t body habitus, 2 IUD strings visualized, unable to fully visualize cervix d/t posterior position and to patient left, but appears pink, visually closed, without lesion, scant white creamy discharge, vaginal walls and external genitalia normal Bimanual exam: Cervix 0/long/high, firm, anterior, neg CMT, uterus nontender, nonenlarged, adnexa without tenderness, enlargement, or mass   IUD Removal  Patient was in the dorsal lithotomy position, normal external genitalia was noted.  A speculum was placed in the patient's vagina, normal discharge was noted, no lesions. The multiparous cervix was visualized, no lesions, no abnormal discharge.  The strings of the IUD were grasped and pulled using ring forceps. The IUD was removed in its entirety. Patient tolerated the procedure well.    Patient plans for pregnancy soon and she was told to avoid teratogens, take PNV and folic acid.  Routine preventative health maintenance measures emphasized.    Assessment/Plan:   1. Well woman exam with routine gynecological exam - Normal well woman exam with IUD removal - Start PNV and folic acid  - Cytology - PAP( [place])   Follow up in: 1  year  or sooner as needed.    [redacted]w[redacted]d, CNM 10/28/21 2:40 PM

## 2021-10-30 LAB — CYTOLOGY - PAP: Diagnosis: NEGATIVE

## 2022-01-27 ENCOUNTER — Emergency Department (INDEPENDENT_AMBULATORY_CARE_PROVIDER_SITE_OTHER)
Admission: EM | Admit: 2022-01-27 | Discharge: 2022-01-27 | Disposition: A | Discharge status: Home / Self Care | Payer: Managed Care, Other (non HMO) | Source: Home / Self Care

## 2022-01-27 ENCOUNTER — Emergency Department (INDEPENDENT_AMBULATORY_CARE_PROVIDER_SITE_OTHER): Payer: Managed Care, Other (non HMO)

## 2022-01-27 ENCOUNTER — Other Ambulatory Visit: Payer: Self-pay

## 2022-01-27 DIAGNOSIS — S6982XA Other specified injuries of left wrist, hand and finger(s), initial encounter: Secondary | ICD-10-CM | POA: Diagnosis not present

## 2022-01-27 DIAGNOSIS — S60042A Contusion of left ring finger without damage to nail, initial encounter: Secondary | ICD-10-CM

## 2022-01-27 DIAGNOSIS — Z3201 Encounter for pregnancy test, result positive: Secondary | ICD-10-CM

## 2022-01-27 LAB — POCT URINE PREGNANCY: Preg Test, Ur: POSITIVE — AB

## 2022-01-27 NOTE — ED Provider Notes (Signed)
?[facility] ? ? ? ?CSN: [id number] ?Arrival date & time: 01/27/22  1715 ? ? ?  ? ?History   ?Chief Complaint ?Chief Complaint  ?Patient presents with  ? Finger Injury  ?  LT ring   ? ? ?HPI ?Kara Cole is a 26 y.o. female.  ? ?HPI 26 year old female presents with left ring finger injury last night.  Patient reports injuring her finger while roughhousing with her husband.  Orts limited range of motion and bruising noted.  Reports pain currently is 6 of 10.  Patient reports that she is possibly pregnant and request pregnancy test. ? ?Past Medical History:  ?Diagnosis Date  ? ADHD (attention deficit hyperactivity disorder)   ? Bilateral ovarian cysts   ? Headache   ? ? ?Patient Active Problem List  ? Diagnosis Date Noted  ? De Quervain's tenosynovitis, right 01/29/2021  ? Normal labor 09/14/2018  ? Premature rupture of membranes 09/13/2018  ? Group B Streptococcus carrier state affecting pregnancy 08/23/2018  ? Supervision of normal first pregnancy, antepartum 03/31/2018  ? Obesity in pregnancy 03/31/2018  ? Low back pain 02/21/2015  ? ? ?History reviewed. No pertinent surgical history. ? ?OB History   ? ? Gravida  ?1  ? Para  ?1  ? Term  ?1  ? Preterm  ?   ? AB  ?   ? Living  ?1  ?  ? ? SAB  ?   ? IAB  ?   ? Ectopic  ?   ? Multiple  ?0  ? Live Births  ?1  ?   ?  ?  ? ? ? ?Home Medications   ? ?Prior to Admission medications   ?Medication Sig Start Date End Date Taking? Authorizing Provider  ?acetaminophen (TYLENOL) 325 MG tablet Take 2 tablets (650 mg total) by mouth every 4 (four) hours as needed (for pain scale < 4). 09/16/18   Arabella Merles, CNM  ?levonorgestrel (MIRENA) 20 MCG/24HR IUD 1 each by Intrauterine route once.    [provider]  ? ? ?Family History ?Family History  ?Problem Relation Age of Onset  ? Hypertension Mother   ? Thyroid disease Mother   ? Depression Mother   ? Hyperlipidemia Father   ? ADD / ADHD Father   ? Hypertension Father   ? Thyroid disease Father   ?  Anxiety disorder Sister   ? ADD / ADHD Sister   ? ADD / ADHD Brother   ? Depression Brother   ? Stroke Maternal Grandfather   ? Cancer Paternal Grandmother   ?     brain  ? Stroke Paternal Grandmother   ? ? ?Social History ?Social History  ? ?Tobacco Use  ? Smoking status: Never  ? Smokeless tobacco: Never  ?Vaping Use  ? Vaping Use: Never used  ?Substance Use Topics  ? Alcohol use: Yes  ?  Alcohol/week: 2.0 - 3.0 standard drinks  ?  Types: 2 - 3 Standard drinks or equivalent per week  ? Drug use: Never  ? ? ? ?Allergies   ?Patient has no known allergies. ? ? ?Review of Systems ?Review of Systems  ?Musculoskeletal:   ?     Left ring finger pain since last night  ? ? ?Physical Exam ?Triage Vital Signs ?ED Triage Vitals  ?Enc Vitals Group  ?   BP 01/27/22 1752 127/83  ?   Pulse Rate 01/27/22 1752 83  ?   Resp 01/27/22 1752 18  ?  Temp 01/27/22 1752 98.6 ?F (37 ?C)  ?   Temp Source 01/27/22 1752 Oral  ?   SpO2 01/27/22 1752 98 %  ?   Weight --   ?   Height --   ?   Head Circumference --   ?   Peak Flow --   ?   Pain Score 01/27/22 1754 6  ?   Pain Loc --   ?   Pain Edu? --   ?   Excl. in GC? --   ? ?No data found. ? ?Updated Vital Signs ?BP 127/83 (BP Location: Right Arm)   Pulse 83   Temp 98.6 ?F (37 ?C) (Oral)   Resp 18   LMP 12/03/2021 (Exact Date)   SpO2 98%  ? ?  ? ?Physical Exam ?Vitals and nursing note reviewed.  ?Constitutional:   ?   General: She is not in acute distress. ?   Appearance: Normal appearance. She is obese. She is not ill-appearing.  ?HENT:  ?   Head: Normocephalic and atraumatic.  ?   Mouth/Throat:  ?   Mouth: Mucous membranes are moist.  ?   Pharynx: Oropharynx is clear.  ?Eyes:  ?   Extraocular Movements: Extraocular movements intact.  ?   Conjunctiva/sclera: Conjunctivae normal.  ?   Pupils: Pupils are equal, round, and reactive to light.  ?Cardiovascular:  ?   Rate and Rhythm: Normal rate and regular rhythm.  ?   Pulses: Normal pulses.  ?   Heart sounds: Normal heart sounds.   ?Pulmonary:  ?   Effort: Pulmonary effort is normal.  ?   Breath sounds: Normal breath sounds. No wheezing, rhonchi or rales.  ?Musculoskeletal:  ?   Cervical back: Normal range of motion and neck supple.  ?   Comments: Left ring finger (palmer aspect over proximal phalanx): TTP with moderate soft tissue swelling noted  ?Skin: ?   General: Skin is warm and dry.  ?Neurological:  ?   General: No focal deficit present.  ?   Mental Status: She is alert and oriented to person, place, and time.  ? ? ? ?UC Treatments / Results  ?Labs ?(all labs ordered are listed, but only abnormal results are displayed) ?Labs Reviewed  ?POCT URINE PREGNANCY - Abnormal; Notable for the following components:  ?    Result Value  ? Preg Test, Ur Positive (*)   ? All other components within normal limits  ? ? ?EKG ? ? ?Radiology ?DG Hand Complete Left ? ?Result Date: 01/27/2022 ?CLINICAL DATA:  Left fourth finger injury. EXAM: LEFT HAND - COMPLETE 3+ VIEW COMPARISON:  None. FINDINGS: There is no evidence of fracture or dislocation. There is no evidence of arthropathy or other focal bone abnormality. Soft tissues are unremarkable. IMPRESSION: Negative. Electronically Signed   By: Lupita RaiderJames  Green Jr M.D.   On: 01/27/2022 18:14   ? ?Procedures ?Procedures (including critical care time) ? ?Medications Ordered in UC ?Medications - No data to display ? ?Initial Impression / Assessment and Plan / UC Course  ?I have reviewed the triage vital signs and the nursing notes. ? ?Pertinent labs & imaging results that were available during my care of the patient were reviewed by me and considered in my medical decision making (see chart for details). ? ?  ? ?MDM: 1.  Contusion of left ring finger without damage to nail, initial encounter-dvised/informed patient of left hand x-ray results this evening negative for acute fracture or subluxation.  Advised patient may take  OTC Tylenol 1000 mg 1-2 times daily, as needed for left ring finger pain.  Only, advised may  RICE affected area of left ring finger for 25 minutes 2-3 times daily for the next 2 to 3 days. 2.  Regnancy confirmed by positive urine test-Advised/encouraged patient to follow-up with OB/GYN for quantitative blood test to determine length of pregnancy this week.  Patient discharged home, he hemodynamically stable. ?Final Clinical Impressions(s) / UC Diagnoses  ? ?Final diagnoses:  ?Contusion of left ring finger without damage to nail, initial encounter  ?Pregnancy confirmed by positive urine test  ? ? ? ?Discharge Instructions   ? ?  ?Advised/informed patient of left hand x-ray results this evening negative for acute fracture or subluxation.  Advised patient may take OTC Tylenol 1000 mg 1-2 times daily, as needed for left ring finger pain.  Only, advised may RICE affected area of left ring finger for 25 minutes 2-3 times daily for the next 2 to 3 days.  Advised/encouraged patient to follow-up with OB/GYN for quantitative blood test to determine length of pregnancy this week. ? ? ? ? ?ED Prescriptions   ?None ?  ? ?PDMP not reviewed this encounter. ?  ?Trevor Iha, FNP ?01/27/22 1851 ? ?

## 2022-01-27 NOTE — Discharge Instructions (Addendum)
Advised/informed patient of left hand x-ray results this evening negative for acute fracture or subluxation.  Advised patient may take OTC Tylenol 1000 mg 1-2 times daily, as needed for left ring finger pain.  Only, advised may RICE affected area of left ring finger for 25 minutes 2-3 times daily for the next 2 to 3 days.  Advised/encouraged patient to follow-up with OB/GYN for quantitative blood test to determine length of pregnancy this week. ?

## 2022-01-27 NOTE — ED Triage Notes (Signed)
Pt c/o LT ring finger injury since last night when she injured her finger rough housing with husband. Ltd ROM. Bruising noted. Pain 6/10 Tylenol prn. Pt might also be pregnant, requesting pregnancy test.  ?

## 2022-01-29 ENCOUNTER — Telehealth: Payer: Self-pay

## 2022-01-29 ENCOUNTER — Encounter (HOSPITAL_COMMUNITY): Payer: Self-pay | Admitting: Obstetrics & Gynecology

## 2022-01-29 ENCOUNTER — Inpatient Hospital Stay (HOSPITAL_COMMUNITY)
Admission: AD | Admit: 2022-01-29 | Discharge: 2022-01-29 | Disposition: A | Discharge status: Home / Self Care | Payer: Managed Care, Other (non HMO) | Attending: Obstetrics & Gynecology | Admitting: Obstetrics & Gynecology

## 2022-01-29 ENCOUNTER — Other Ambulatory Visit: Payer: Self-pay

## 2022-01-29 ENCOUNTER — Inpatient Hospital Stay (HOSPITAL_COMMUNITY): Payer: Managed Care, Other (non HMO)

## 2022-01-29 DIAGNOSIS — O3680X0 Pregnancy with inconclusive fetal viability, not applicable or unspecified: Secondary | ICD-10-CM

## 2022-01-29 DIAGNOSIS — O26891 Other specified pregnancy related conditions, first trimester: Secondary | ICD-10-CM

## 2022-01-29 DIAGNOSIS — O209 Hemorrhage in early pregnancy, unspecified: Secondary | ICD-10-CM | POA: Diagnosis present

## 2022-01-29 DIAGNOSIS — Z3A08 8 weeks gestation of pregnancy: Secondary | ICD-10-CM | POA: Insufficient documentation

## 2022-01-29 DIAGNOSIS — R109 Unspecified abdominal pain: Secondary | ICD-10-CM | POA: Diagnosis not present

## 2022-01-29 LAB — COMPREHENSIVE METABOLIC PANEL
ALT: 23 U/L (ref 0–44)
AST: 16 U/L (ref 15–41)
Albumin: 4 g/dL (ref 3.5–5.0)
Alkaline Phosphatase: 50 U/L (ref 38–126)
Anion gap: 7 (ref 5–15)
BUN: 14 mg/dL (ref 6–20)
CO2: 24 mmol/L (ref 22–32)
Calcium: 9.3 mg/dL (ref 8.9–10.3)
Chloride: 106 mmol/L (ref 98–111)
Creatinine, Ser: 0.76 mg/dL (ref 0.44–1.00)
GFR, Estimated: >60 mL/min (ref >60)
Glucose, Bld: 99 mg/dL (ref 70–99)
Potassium: 4 mmol/L (ref 3.5–5.1)
Sodium: 137 mmol/L (ref 135–145)
Total Bilirubin: 0.6 mg/dL (ref 0.3–1.2)
Total Protein: 6.9 g/dL (ref 6.5–8.1)

## 2022-01-29 LAB — CBC
HCT: 34.7 % — ABNORMAL LOW (ref 36.0–46.0)
Hemoglobin: 11.1 g/dL — ABNORMAL LOW (ref 12.0–15.0)
MCH: 28.8 pg (ref 26.0–34.0)
MCHC: 32 g/dL (ref 30.0–36.0)
MCV: 89.9 fL (ref 80.0–100.0)
Platelets: 193 10*3/uL (ref 150–400)
RBC: 3.86 MIL/uL — ABNORMAL LOW (ref 3.87–5.11)
RDW: 12.2 % (ref 11.5–15.5)
WBC: 6.1 10*3/uL (ref 4.0–10.5)
nRBC: 0 % (ref 0.0–0.2)

## 2022-01-29 LAB — TYPE AND SCREEN
ABO/RH(D): O POS
Antibody Screen: NEGATIVE

## 2022-01-29 LAB — HCG, QUANTITATIVE, PREGNANCY: hCG, Beta Chain, Quant, S: 6 m[IU]/mL — ABNORMAL HIGH (ref <5)

## 2022-01-29 NOTE — Telephone Encounter (Signed)
Pt states she is pregnant and started bleeding last night. Pt states bleeding has increased since last night and is heavier than a regular cycle for her. Pt was told to go to the [facility]U. Address to MAU given to pt. Pt expressed understanding.  ?

## 2022-01-29 NOTE — MAU Note (Signed)
Kara Cole is a 26 y.o. at [redacted]w[redacted]d here in [facility] reporting: started cramping last night and then also started bleeding. Symptoms have continued into today. States bleeding is heavier then a normal period and has changed a pad once. Seeing some small clots. Last IC was a couple days ago.  ? ?LMP: 12/04/21 ? ?Onset of complaint: last night ? ?Pain score: 4/10 ? ?Vitals:  ? 01/29/22 0946  ?BP: 124/83  ?Pulse: 74  ?Resp: 16  ?Temp: 98.8 ?F (37.1 ?C)  ?SpO2: 99%  ?   ?Lab orders placed from triage: UA, unable to give sample ? ?

## 2022-01-29 NOTE — MAU Provider Note (Signed)
?History  ?  ? ?[id number] ? ?Arrival date and time: 01/29/22 8527 ?  ? ?Chief Complaint  ?Patient presents with  ? Abdominal Pain  ? Vaginal Bleeding  ? ? ? ?HPI ?Kara Cole is a 26 y.o. at [redacted]w[redacted]d by unsure LMP with PMHx notable for one prior NSVD, who presents for vaginal bleeding and abdominal cramping.  ? ?Patient reports she had an IUD in until two months ago ?Had one period and then found out she was pregnant a few days ago ?Last night started to have some lower abdominal cramping and then vaginal bleeding, a little heavier than a period ?Last intercourse was several days ago ? ? ?--/--/O POS (03/23 1049) ? ?OB History   ? ? Gravida  ?2  ? Para  ?1  ? Term  ?1  ? Preterm  ?   ? AB  ?   ? Living  ?1  ?  ? ? SAB  ?   ? IAB  ?   ? Ectopic  ?   ? Multiple  ?0  ? Live Births  ?1  ?   ?  ?  ? ? ?Past Medical History:  ?Diagnosis Date  ? ADHD (attention deficit hyperactivity disorder)   ? Bilateral ovarian cysts   ? Headache   ? ? ?History reviewed. No pertinent surgical history. ? ?Family History  ?Problem Relation Age of Onset  ? Hypertension Mother   ? Thyroid disease Mother   ? Depression Mother   ? Hyperlipidemia Father   ? ADD / ADHD Father   ? Hypertension Father   ? Thyroid disease Father   ? Anxiety disorder Sister   ? ADD / ADHD Sister   ? ADD / ADHD Brother   ? Depression Brother   ? Stroke Maternal Grandfather   ? Cancer Paternal Grandmother   ?     brain  ? Stroke Paternal Grandmother   ? ? ?Social History  ? ?Socioeconomic History  ? Marital status: Married  ?  Spouse name: Not on file  ? Number of children: Not on file  ? Years of education: Not on file  ? Highest education level: Not on file  ?Occupational History  ? Occupation: server  ?Tobacco Use  ? Smoking status: Never  ? Smokeless tobacco: Never  ?Vaping Use  ? Vaping Use: Never used  ?Substance and Sexual Activity  ? Alcohol use: Yes  ?  Alcohol/week: 2.0 - 3.0 standard drinks  ?  Types: 2 - 3 Standard drinks or equivalent per week  ?  Drug use: Never  ? Sexual activity: Yes  ?  Partners: Male  ?Other Topics Concern  ? Not on file  ?Social History Narrative  ? ** Merged History Encounter **  ?    ? ?Social Determinants of Health  ? ?Financial Resource Strain: Not on file  ?Food Insecurity: Not on file  ?Transportation Needs: Not on file  ?Physical Activity: Not on file  ?Stress: Not on file  ?Social Connections: Not on file  ?Intimate Partner Violence: Not on file  ? ? ?No Known Allergies ? ?No current facility-administered medications on file prior to encounter.  ? ?Current Outpatient Medications on File Prior to Encounter  ?Medication Sig Dispense Refill  ? Prenatal Vit-Fe Fumarate-FA (PRENATAL MULTIVITAMIN) TABS tablet Take 1 tablet by mouth daily at 12 noon.    ? acetaminophen (TYLENOL) 325 MG tablet Take 2 tablets (650 mg total) by mouth every 4 (four) hours as needed (for  pain scale < 4). 30 tablet 0  ? levonorgestrel (MIRENA) 20 MCG/24HR IUD 1 each by Intrauterine route once.    ? ? ? ?ROS ?Pertinent positives and negative per HPI, all others reviewed and negative ? ?Physical Exam  ? ?BP 124/83 (BP Location: Left Arm)   Pulse 74   Temp 98.8 ?F (37.1 ?C) (Oral)   Resp 16   Ht 5\' 5"  (1.651 m)   Wt 111.9 kg   LMP 12/04/2021   SpO2 99% Comment: room air  BMI 41.07 kg/m?  ? ?Patient Vitals for the past 24 hrs: ? BP Temp Temp src Pulse Resp SpO2 Height Weight  ?01/29/22 0946 124/83 98.8 ?F (37.1 ?C) Oral 74 16 99 % -- --  ?01/29/22 0934 -- -- -- -- -- -- 5\' 5"  (1.651 m) 111.9 kg  ? ? ?Physical Exam ?Vitals reviewed.  ?Constitutional:   ?   General: She is not in acute distress. ?   Appearance: She is well-developed. She is not diaphoretic.  ?Eyes:  ?   General: No scleral icterus. ?Pulmonary:  ?   Effort: Pulmonary effort is normal. No respiratory distress.  ?Abdominal:  ?   General: There is no distension.  ?   Palpations: Abdomen is soft.  ?   Tenderness: There is no abdominal tenderness. There is no guarding or rebound.  ?Skin: ?    General: Skin is warm and dry.  ?Neurological:  ?   Mental Status: She is alert.  ?   Coordination: Coordination normal.  ?  ? ?Cervical Exam ?  ? ?Bedside Ultrasound ?Pt informed that the ultrasound is considered a limited OB ultrasound and is not intended to be a complete ultrasound exam.  Patient also informed that the ultrasound is not being completed with the intent of assessing for fetal or placental anomalies or any pelvic abnormalities.  Explained that the purpose of today?s ultrasound is to assess for  viability.  Patient acknowledges the purpose of the exam and the limitations of the study.   ? ?My interpretation: difficult exam due to habitus and early gestation but appears to have IUGS and fetal pole ? ? ?Labs ?Results for orders placed or performed during the hospital encounter of 01/29/22 (from the past 24 hour(s))  ?CBC     Status: Abnormal  ? Collection Time: 01/29/22 10:49 AM  ?Result Value Ref Range  ? WBC 6.1 4.0 - 10.5 K/uL  ? RBC 3.86 (L) 3.87 - 5.11 MIL/uL  ? Hemoglobin 11.1 (L) 12.0 - 15.0 g/dL  ? HCT 34.7 (L) 36.0 - 46.0 %  ? MCV 89.9 80.0 - 100.0 fL  ? MCH 28.8 26.0 - 34.0 pg  ? MCHC 32.0 30.0 - 36.0 g/dL  ? RDW 12.2 11.5 - 15.5 %  ? Platelets 193 150 - 400 K/uL  ? nRBC 0.0 0.0 - 0.2 %  ?Comprehensive metabolic panel     Status: None  ? Collection Time: 01/29/22 10:49 AM  ?Result Value Ref Range  ? Sodium 137 135 - 145 mmol/L  ? Potassium 4.0 3.5 - 5.1 mmol/L  ? Chloride 106 98 - 111 mmol/L  ? CO2 24 22 - 32 mmol/L  ? Glucose, Bld 99 70 - 99 mg/dL  ? BUN 14 6 - 20 mg/dL  ? Creatinine, Ser 0.76 0.44 - 1.00 mg/dL  ? Calcium 9.3 8.9 - 10.3 mg/dL  ? Total Protein 6.9 6.5 - 8.1 g/dL  ? Albumin 4.0 3.5 - 5.0 g/dL  ? AST 16 15 -  41 U/L  ? ALT 23 0 - 44 U/L  ? Alkaline Phosphatase 50 38 - 126 U/L  ? Total Bilirubin 0.6 0.3 - 1.2 mg/dL  ? GFR, Estimated >60 >60 mL/min  ? Anion gap 7 5 - 15  ?hCG, quantitative, pregnancy     Status: Abnormal  ? Collection Time: 01/29/22 10:49 AM  ?Result Value Ref  Range  ? hCG, Beta Chain, Quant, S 6 (H) <5 mIU/mL  ?Type and screen [facility]     Status: None  ? Collection Time: 01/29/22 10:49 AM  ?Result Value Ref Range  ? ABO/RH(D) O POS   ? Antibody Screen NEG   ? Sample Expiration    ?  02/01/2022,2359 ?Performed at [facility], [address] [place], [place] [id number] ?  ? ? ?Imaging ?US OB LESS THAN 14 WEEKS WITH OB TRANSVAGINAL ? ?Result Date: 01/29/2022 ?CLINICAL DATA:  Vaginal bleeding EXAM: OBSTETRIC <14 WK US AND TRANSVAGINAL OB US TECHNIQUE: Both transabdominal and transvaginal ultrasound examinations were performed for complete evaluation of the gestation as well as the maternal uterus, adnexal regions, and pelvic cul-de-sac. Transvaginal technique was performed to assess early pregnancy. COMPARISON:  None. FINDINGS: Intrauterine gestational sac: None Yolk sac:  Not Visualized. Embryo:  Not Visualized. Cardiac Activity: Not Visualized. Heart Rate: NA Subchorionic hemorrhage:  None visualized. Maternal uterus/adnexae: Heterogeneous appearance of the endometrium. Normal appearance of the bilateral ovaries. IMPRESSION: No intrauterine gestational sac, yolk sac, or fetal pole identified. Differential considerations include failed pregnancy or pregnancy of unknown location, correlate with beta HCG. Electronically Signed   By: Allegra LaiLeah  Strickland M.D.   On: 01/29/2022 12:08   ? ?MAU Course  ?Procedures ?Lab Orders    ?     Urinalysis, Routine w reflex microscopic Urine, Clean Catch    ?     CBC    ?     Comprehensive metabolic panel    ?     hCG, quantitative, pregnancy    ?No orders of the defined types were placed in this encounter. ? ?Imaging Orders    ?     US OB LESS THAN 14 WEEKS WITH OB TRANSVAGINAL    ? ? ?MDM ?moderate ? ?Assessment and Plan  ?#Pregnancy of unknown location ?#[redacted] weeks gestation of pregnancy ?#Vaginal bleeding in pregnancy, first trimester ?#Abdominal pain in pregnancy, first trimester ?HCG 6, TVUS unremarkable with no  evidence of IUP or ectopic, blood type O pos. Discussed with patient that based on symptoms this is likely a missed AB, though early IUP/implantation is technically also a possibility. Reassuring no evidence of ectop

## 2022-01-31 ENCOUNTER — Inpatient Hospital Stay (HOSPITAL_COMMUNITY): Admit: 2022-01-31 | Payer: Managed Care, Other (non HMO)

## 2022-03-02 ENCOUNTER — Encounter: Payer: Self-pay | Admitting: Medical-Surgical

## 2022-03-02 ENCOUNTER — Ambulatory Visit: Payer: Managed Care, Other (non HMO) | Admitting: Medical-Surgical

## 2022-03-02 VITALS — BP 122/84 | HR 80 | Resp 20 | Ht 65.0 in | Wt 249.8 lb

## 2022-03-02 DIAGNOSIS — T24212A Burn of second degree of left thigh, initial encounter: Secondary | ICD-10-CM | POA: Diagnosis not present

## 2022-03-02 DIAGNOSIS — Z7689 Persons encountering health services in other specified circumstances: Secondary | ICD-10-CM

## 2022-03-02 DIAGNOSIS — L03116 Cellulitis of left lower limb: Secondary | ICD-10-CM

## 2022-03-02 MED ORDER — FLUCONAZOLE 150 MG PO TABS: 150.0000 mg | ORAL_TABLET | Freq: Once | ORAL | 0 refills | Status: AC

## 2022-03-02 MED ORDER — CEPHALEXIN 500 MG PO CAPS: 500.0000 mg | ORAL_CAPSULE | Freq: Three times a day (TID) | ORAL | 0 refills | Status: DC

## 2022-03-02 NOTE — Progress Notes (Signed)
?  HPI with pertinent ROS:  ? ?CC: Burn to left thigh, weight loss ? ?HPI: ?Pleasant 26 year old female presenting today for evaluation of a burn on her left thigh.  Notes that several days ago, she was ironing and was using a very small arcing board.  She put on her lap in order to iron the color of her garment and when she had the same button, it burned a spot on her left thigh just above the knee.  It originally had an intact blister but this ruptured a couple of days ago.  Since then, she has developed circumscribed erythema as well as discomfort at the site.  She has been using Neosporin for the past 4 days topically and keeping it covered with gauze from her first-aid kit.  No purulent drainage, fevers, or chills noted. ? ?Has a relative that is currently taking Mounjaro for weight loss.  Is interested in getting on a medication similar to help with weight loss.  She is exercising and working on eating healthier with smaller portions but feels like her progress is stalled and/or moving backwards. ? ?I reviewed the past medical history, family history, social history, surgical history, and allergies today and no changes were needed.  Please see the problem list section below in epic for further details. ? ? ?Physical exam:  ? ?General: Well Developed, well nourished, and in no acute distress.  ?Neuro: Alert and oriented x3.  ?HEENT: Normocephalic, atraumatic.  ?Skin: Warm and dry. Second degree burn to the left thigh just above the medial knee with a ruptured blister measuring approximately 2.5cm x 3cm. 2cm of circumscribed erythema.  ?Cardiac: Regular rate and rhythm, no murmurs rubs or gallops, no lower extremity edema.  ?Respiratory: Clear to auscultation bilaterally. Not using accessory muscles, speaking in full sentences. ? ?Impression and Recommendations:   ? ?1. Partial thickness burn of left thigh, initial encounter ?2. Cellulitis of left lower extremity ?Dressing changed with Xeroform gauze applied to  the open blister and covered by Tegaderm.  Recommend getting Vaseline gauze over-the-counter and Tegaderm to keep it covered and protected while wearing close.  Wash daily with warm soapy water, rinse well, pat dry then apply dressing.  Recommend avoiding further use of Neosporin as this can worsen redness and irritation.  Sending in Keflex 500 mg 3 times daily for treatment of surrounding cellulitis. ? ?3. Encounter for weight management ?Advised to contact her insurance regarding coverage of weight loss medications.  She does not have any chronic conditions that will help with approval of Wegovy/Mounjaro but is worth a try.  She will let us know what her insurance covers and we will make that choice then. ? ?Return if symptoms worsen or fail to improve. ?___________________________________________ ?Clearnce Sorrel, DNP, APRN, FNP-BC ?Primary Care and Sports Medicine ?[facility] ?

## 2022-03-05 ENCOUNTER — Encounter: Payer: Self-pay | Admitting: Medical-Surgical

## 2022-03-05 ENCOUNTER — Telehealth (INDEPENDENT_AMBULATORY_CARE_PROVIDER_SITE_OTHER): Payer: Managed Care, Other (non HMO) | Admitting: Medical-Surgical

## 2022-03-05 DIAGNOSIS — Z7689 Persons encountering health services in other specified circumstances: Secondary | ICD-10-CM | POA: Diagnosis not present

## 2022-03-05 MED ORDER — WEGOVY 0.25 MG/0.5ML ~~LOC~~ SOAJ: 0.2500 mg | SUBCUTANEOUS | 0 refills | Status: DC

## 2022-03-05 NOTE — Progress Notes (Signed)
Virtual Visit via Telephone ?  ?I connected with  Kara Cole  on 03/05/22 by telephone/telehealth and verified that I am speaking with the correct person using two identifiers. ?  ?I discussed the limitations, risks, security and privacy concerns of performing an evaluation and management service by telephone, including the higher likelihood of inaccurate diagnosis and treatment, and the availability of in person appointments.  We also discussed the likely need of an additional face to face encounter for complete and high quality delivery of care.  I also discussed with the patient that there may be a patient responsible charge related to this service. The patient expressed understanding and wishes to proceed. ? ?Provider location is in medical facility. ?Patient location is at their home, different from provider location. ?People involved in care of the patient during this telehealth encounter were myself, my nurse/medical assistant, and my front office/scheduling team member. ? ?CC: Follow-up on weight loss meds ? ?HPI: ?Pleasant 26 year old female presenting today via MyChart without video access to follow-up on our discussion regarding weight loss medications.  She was able to get in touch with her insurance company and ask if they cover [facility] specifically.  They reported that they did cover it with a prior authorization.  She did not know of other medications to ask for but is open to other weight loss medications that may be beneficial.  She has never tried any other medications.  She has tried a low calorie diet, regular intentional exercise several times weekly, and portion control without great benefit in weight loss. ? ?Review of Systems: See HPI for pertinent positives and negatives.  ? ?Objective Findings:   ? ?General: Speaking full sentences, no audible heavy breathing.  Sounds alert and appropriately interactive.   ? ?Impression and Recommendations:   ? ?1. Encounter for weight  management ?After discussion, would feel more comfortable prescribing a medication indicated for weight loss at this point.  Sending in Wegovy 0.25 mg weekly for 4 weeks then increase to 0.5 mg weekly.  Advised that this may not be covered by insurance due to prior Auth requirements and no significant comorbidities.  Advised patient to contact me via phone or MyChart message should this be denied and we will reevaluate for other options.  Continue regular intentional exercise and a low calorie diet with portion control. ? ?I discussed the above assessment and treatment plan with the patient. The patient was provided an opportunity to ask questions and all were answered. The patient agreed with the plan and demonstrated an understanding of the instructions. ?  ?The patient was advised to call back or seek an in-person evaluation if the symptoms worsen or if the condition fails to improve as anticipated. ? ?20 minutes of non-face-to-face time was provided during this encounter. ? ?Return for Weight loss follow-up in 4 weeks if able to get [facility]. ?___________________________________________ ?Christen Butter, DNP, APRN, FNP-BC ?Primary Care and Sports Medicine ?[facility] ? ?

## 2022-03-14 ENCOUNTER — Encounter: Payer: Self-pay | Admitting: Medical-Surgical

## 2022-03-16 MED ORDER — WEGOVY 0.25 MG/0.5ML ~~LOC~~ SOAJ: 0.2500 mg | SUBCUTANEOUS | 0 refills | Status: DC

## 2022-06-09 ENCOUNTER — Ambulatory Visit: Payer: Managed Care, Other (non HMO) | Admitting: Obstetrics and Gynecology

## 2022-06-09 VITALS — BP 116/81 | HR 72 | Ht 65.0 in | Wt 251.0 lb

## 2022-06-09 DIAGNOSIS — Z3009 Encounter for other general counseling and advice on contraception: Secondary | ICD-10-CM | POA: Diagnosis not present

## 2022-06-09 DIAGNOSIS — Z30011 Encounter for initial prescription of contraceptive pills: Secondary | ICD-10-CM

## 2022-06-09 MED ORDER — SLYND 4 MG PO TABS: 1.0000 | ORAL_TABLET | Freq: Every day | ORAL | 11 refills | Status: DC

## 2022-06-09 NOTE — Progress Notes (Signed)
    Contraception/Family Planning VISIT ENCOUNTER NOTE  Subjective:   Kara Cole is a 26 y.o. [id number] female here for reproductive life counseling.  Desires POP's from [facility].  Reports she does want a pregnancy in the next year. Denies abnormal vaginal bleeding, discharge, pelvic pain, problems with intercourse or other gynecologic concerns.   Had IUD pulled this year, wants to try for a pregnancy. Has not been able to conceive since IUD has been pulled. Decided she would like to take some time to work on her health and her weight. She is scheduled to see bariatric weight loss through [facility]. Would like BC pills until she gets to her desired weight.   Gynecologic History Patient's last menstrual period was 04/24/2022. Contraception: oral progesterone-only contraceptive  Health Maintenance Due  Topic Date Due   COVID-19 Vaccine (3 - Pfizer series) 09/14/2020   INFLUENZA VACCINE  06/09/2022     The following portions of the patient's history were reviewed and updated as appropriate: allergies, current medications, past family history, past medical history, past social history, past surgical history and problem list.  Review of Systems Pertinent items are noted in HPI.   Objective:  BP 116/81   Pulse 72   Ht 5\' 5"  (1.651 m)   Wt 251 lb (113.9 kg)   LMP 04/24/2022   BMI 41.77 kg/m  Gen: well appearing, NAD HEENT: no scleral icterus CV: RR Lung: Normal WOB Ext: warm well perfused  PELVIC: Deferred   Assessment and Plan:   Contraception counseling: Reviewed all forms of birth control options available including abstinence; over the counter/barrier methods; hormonal contraceptive medication including pill, patch, ring, injection,contraceptive implant; hormonal and nonhormonal IUDs; permanent sterilization options including vasectomy and the various tubal sterilization modalities. Risks and benefits reviewed.  Questions were answered.  Written information was also given to  the patient to review.  Patient desires progesterone only pills, this was prescribed for patient. She will follow up in 6 months for surveillance.  She was told to call with any further questions, or with any concerns about this method of contraception.  Emphasized use of condoms 100% of the time for STI prevention.   1. Birth control counseling     Rx: Slynd Prenatal vitamins + additional folic acid recommended.     04/26/2022, NP Faculty Practice- [facility]

## 2022-07-07 ENCOUNTER — Telehealth: Payer: Self-pay | Admitting: *Deleted

## 2022-07-07 MED ORDER — SLYND 4 MG PO TABS: 1.0000 | ORAL_TABLET | Freq: Every day | ORAL | 11 refills | Status: DC

## 2022-07-07 NOTE — Telephone Encounter (Cosign Needed)
Pt's insurance is not wanting to give PA for Slynd.  RX resent to [facility] in [place], [place].  They can send it to pt for 20.00 a month.  Pt notified.

## 2022-09-09 ENCOUNTER — Ambulatory Visit
Admission: EM | Admit: 2022-09-09 | Discharge: 2022-09-09 | Disposition: A | Discharge status: Home / Self Care | Payer: Managed Care, Other (non HMO) | Attending: Family Medicine | Admitting: Family Medicine

## 2022-09-09 ENCOUNTER — Encounter: Payer: Self-pay | Admitting: Emergency Medicine

## 2022-09-09 DIAGNOSIS — J029 Acute pharyngitis, unspecified: Secondary | ICD-10-CM | POA: Diagnosis not present

## 2022-09-09 LAB — POCT RAPID STREP A (OFFICE): Rapid Strep A Screen: NEGATIVE

## 2022-09-09 NOTE — ED Provider Notes (Signed)
Kara Cole CARE    CSN: [id number] Arrival date & time: 09/09/22  1718      History   Chief Complaint Chief Complaint  Patient presents with   Sore Throat    HPI Kara Cole is a 26 y.o. female.   HPI\  Patient states she had COVID about 6 weeks ago.  She got over that and now has a sore throat.  She feels rundown.  No coughing or congestion.  No fever or chills.  Sore throat is been going on for 2 to 3 days.  Past Medical History:  Diagnosis Date   ADHD (attention deficit hyperactivity disorder)    Bilateral ovarian cysts    Headache     Patient Active Problem List   Diagnosis Date Noted   De Quervain's tenosynovitis, right 01/29/2021   Normal labor 09/14/2018   Premature rupture of membranes 09/13/2018   Group B Streptococcus carrier state affecting pregnancy 08/23/2018   Supervision of normal first pregnancy, antepartum 03/31/2018   Obesity in pregnancy 03/31/2018   Low back pain 02/21/2015    History reviewed. No pertinent surgical history.  OB History     Gravida  2   Para  1   Term  1   Preterm      AB      Living  1      SAB      IAB      Ectopic      Multiple  0   Live Births  1            Home Medications    Prior to Admission medications   Medication Sig Start Date End Date Taking? Authorizing Provider  Drospirenone (SLYND) 4 MG TABS Take 1 tablet by mouth daily. 07/07/22  Yes Rasch, Artist Pais, NP    Family History Family History  Problem Relation Age of Onset   Hypertension Mother    Thyroid disease Mother    Depression Mother    Hyperlipidemia Father    ADD / ADHD Father    Hypertension Father    Thyroid disease Father    Anxiety disorder Sister    ADD / ADHD Sister    ADD / ADHD Brother    Depression Brother    Stroke Maternal Grandfather    Cancer Paternal Grandmother        brain   Stroke Paternal Grandmother     Social History Social History   Tobacco Use   Smoking status:  Never   Smokeless tobacco: Never  Vaping Use   Vaping Use: Never used  Substance Use Topics   Alcohol use: Yes    Alcohol/week: 2.0 - 3.0 standard drinks of alcohol    Types: 2 - 3 Standard drinks or equivalent per week   Drug use: Never     Allergies   Patient has no known allergies.   Review of Systems Review of Systems See HPI  Physical Exam Triage Vital Signs ED Triage Vitals  Enc Vitals Group     BP 09/09/22 1726 127/86     Pulse Rate 09/09/22 1726 81     Resp 09/09/22 1726 18     Temp 09/09/22 1726 98.5 F (36.9 C)     Temp Source 09/09/22 1726 Oral     SpO2 09/09/22 1726 97 %     Weight 09/09/22 1728 230 lb (104.3 kg)     Height 09/09/22 1728 5\' 5"  (1.651 m)     Head Circumference --  Peak Flow --      Pain Score 09/09/22 1728 4     Pain Loc --      Pain Edu? --      Excl. in [place]? --    No data found.  Updated Vital Signs BP 127/86 (BP Location: Right Arm)   Pulse 81   Temp 98.5 F (36.9 C) (Oral)   Resp 18   Ht 5\' 5"  (1.651 m)   Wt 104.3 kg   LMP 06/23/2022   SpO2 97%   BMI 38.27 kg/m       Physical Exam Constitutional:      General: She is not in acute distress.    Appearance: She is well-developed. She is not ill-appearing.  HENT:     Head: Normocephalic and atraumatic.     Right Ear: Tympanic membrane and ear canal normal.     Left Ear: Tympanic membrane and ear canal normal.     Nose: Rhinorrhea present. No congestion.     Mouth/Throat:     Pharynx: Posterior oropharyngeal erythema present.     Tonsils: No tonsillar exudate or tonsillar abscesses. 1+ on the right. 1+ on the left.  Eyes:     Conjunctiva/sclera: Conjunctivae normal.     Pupils: Pupils are equal, round, and reactive to light.  Cardiovascular:     Rate and Rhythm: Normal rate.  Pulmonary:     Effort: Pulmonary effort is normal. No respiratory distress.  Abdominal:     General: There is no distension.     Palpations: Abdomen is soft.  Musculoskeletal:         General: Normal range of motion.     Cervical back: Normal range of motion.  Lymphadenopathy:     Cervical: No cervical adenopathy.  Skin:    General: Skin is warm and dry.  Neurological:     Mental Status: She is alert.  Psychiatric:        Mood and Affect: Mood normal.        Behavior: Behavior normal.      UC Treatments / Results  Labs (all labs ordered are listed, but only abnormal results are displayed) Labs Reviewed  POCT RAPID STREP A (OFFICE)    EKG   Radiology No results found.  Procedures Procedures (including critical care time)  Medications Ordered in UC Medications - No data to display  Initial Impression / Assessment and Plan / UC Course  I have reviewed the triage vital signs and the nursing notes.  Pertinent labs & imaging results that were available during my care of the patient were reviewed by me and considered in my medical decision making (see chart for details).    Rapid strep test is negative.  COVID testing not indicated. Symptomatic care recommended Final Clinical Impressions(s) / UC Diagnoses   Final diagnoses:  Viral pharyngitis     Discharge Instructions      Drink lots of water Tylenol, ibuprofen, or Aleve for sore throat pain May try Chloraseptic spray or lozenges for sore throat pain The swab has been sent to the laboratory for culture.  You will be called if it is positive   ED Prescriptions   None    PDMP not reviewed this encounter.   Raylene Everts, MD 09/09/22 [phone]

## 2022-09-09 NOTE — Discharge Instructions (Signed)
Drink lots of water Tylenol, ibuprofen, or Aleve for sore throat pain May try Chloraseptic spray or lozenges for sore throat pain The swab has been sent to the laboratory for culture.  You will be called if it is positive

## 2022-09-09 NOTE — ED Triage Notes (Signed)
Patient c/o sore throat, feeling run down, no cough, congestion x 2 days.  Patient has taken Tylenol.

## 2022-11-17 ENCOUNTER — Encounter: Payer: Self-pay | Admitting: Obstetrics and Gynecology

## 2022-11-17 ENCOUNTER — Ambulatory Visit (INDEPENDENT_AMBULATORY_CARE_PROVIDER_SITE_OTHER): Payer: Managed Care, Other (non HMO) | Admitting: Obstetrics and Gynecology

## 2022-11-17 VITALS — BP 137/79 | HR 90 | Ht 65.0 in | Wt 252.0 lb

## 2022-11-17 DIAGNOSIS — Z0000 Encounter for general adult medical examination without abnormal findings: Secondary | ICD-10-CM

## 2022-11-17 DIAGNOSIS — N926 Irregular menstruation, unspecified: Secondary | ICD-10-CM

## 2022-11-17 NOTE — Progress Notes (Addendum)
GYNECOLOGY ANNUAL PREVENTATIVE CARE ENCOUNTER NOTE  History:     Kara Cole is a 27 y.o. G61P1001 female here for a routine annual gynecologic exam.  Current complaints: None.   Denies abnormal vaginal bleeding, discharge, pelvic pain, problems with intercourse or other gynecologic concerns.   August was last period lasted 2 days. Was recently on Slynd and stopped due to acne, started period 2 days after stopping. Hx of irregular periods. No abnormal facial hair. Unable to get weight loss medication because she does not meet qualifications through her insurance.   Recent SAB @ 6 weeks. Desires future pregnancy.  Borderlines elevated BP readings. Recommend home BP monitoring. Low threshold to initiate antihypertensives prior to pregnancy.   Gynecologic History Patient's last menstrual period was 08/20/2021 (approximate). Contraception: none Last Pap: 2022. Result was normal with negative HPV  Obstetric History OB History  Gravida Para Term Preterm AB Living  2 1 1     1   SAB IAB Ectopic Multiple Live Births        0 1    # Outcome Date GA Lbr Len/2nd Weight Sex Delivery Anes PTL Lv  2 Gravida           1 Term 09/14/18 [redacted]w[redacted]d 35:16 / 02:05 3.289 kg M Vag-Spont EPI  LIV    Past Medical History:  Diagnosis Date   ADHD (attention deficit hyperactivity disorder)    Bilateral ovarian cysts    Headache     History reviewed. No pertinent surgical history.  Current Outpatient Medications on File Prior to Visit  Medication Sig Dispense Refill   Drospirenone (SLYND) 4 MG TABS Take 1 tablet by mouth daily. (Patient not taking: Reported on 11/17/2022) 28 tablet 11   No current facility-administered medications on file prior to visit.    No Known Allergies  Social History:  reports that she has never smoked. She has never used smokeless tobacco. She reports current alcohol use of about 2.0 - 3.0 standard drinks of alcohol per week. She reports that she does not use  drugs.  Family History  Problem Relation Age of Onset   Hypertension Mother    Thyroid disease Mother    Depression Mother    Hyperlipidemia Father    ADD / ADHD Father    Hypertension Father    Thyroid disease Father    Anxiety disorder Sister    ADD / ADHD Sister    ADD / ADHD Brother    Depression Brother    Stroke Maternal Grandfather    Cancer Paternal Grandmother        brain   Stroke Paternal Grandmother     The following portions of the patient's history were reviewed and updated as appropriate: allergies, current medications, past family history, past medical history, past social history, past surgical history and problem list.  Review of Systems Pertinent items noted in HPI and remainder of comprehensive ROS otherwise negative.  Physical Exam:  BP 137/79   Pulse 90   Ht 5\' 5"  (1.651 m)   Wt 114.3 kg   LMP 08/20/2021 (Approximate)   BMI 41.93 kg/m  CONSTITUTIONAL: Well-developed, well-nourished female in no acute distress.  HENT:  Normocephalic, atraumatic, External right and left ear normal.  EYES: Conjunctivae and EOM are normal. Pupils are equal, round, and reactive to light. No scleral icterus.  NECK: Normal range of motion, supple, no masses.  Normal thyroid.  SKIN: Skin is warm and dry. No rash noted. Not diaphoretic. No erythema. No  pallor. MUSCULOSKELETAL: Normal range of motion. No tenderness.  No cyanosis, clubbing, or edema. NEUROLOGIC: Alert and oriented to person, place, and time. Normal reflexes, muscle tone coordination.  PSYCHIATRIC: Normal mood and affect. Normal behavior. Normal judgment and thought content. CARDIOVASCULAR: Normal heart rate noted, regular rhythm RESPIRATORY: Clear to auscultation bilaterally. Effort and breath sounds normal, no problems with respiration noted. BREASTS: Symmetric in size. No masses, tenderness, skin changes, nipple drainage, or lymphadenopathy bilaterally. Performed in the presence of a chaperone. ABDOMEN: Soft,  no distention noted.  No tenderness, rebound or guarding.  PELVIC: Deferred  Assessment and Plan:   1. Annual physical exam  - CBC - Comprehensive metabolic panel - TSH - Hemoglobin A1c - pap not due today.  - Prenatal vitamins daily.   - US PELVIC COMPLETE WITH TRANSVAGINAL; Future   Routine preventative health maintenance measures emphasized. Please refer to After Visit Summary for other counseling recommendations.    Kara Cole, Harolyn Rutherford, NP [facility] for [employer], [facility]

## 2022-11-24 ENCOUNTER — Ambulatory Visit: Payer: Managed Care, Other (non HMO)

## 2022-11-24 DIAGNOSIS — N926 Irregular menstruation, unspecified: Secondary | ICD-10-CM | POA: Diagnosis not present

## 2022-11-28 LAB — COMPREHENSIVE METABOLIC PANEL
ALT: 23 IU/L (ref 0–32)
AST: 13 IU/L (ref 0–40)
Albumin/Globulin Ratio: 1.7 (ref 1.2–2.2)
Albumin: 4.7 g/dL (ref 4.0–5.0)
Alkaline Phosphatase: 77 IU/L (ref 44–121)
BUN/Creatinine Ratio: 18 (ref 9–23)
BUN: 14 mg/dL (ref 6–20)
Bilirubin Total: 0.3 mg/dL (ref 0.0–1.2)
CO2: 22 mmol/L (ref 20–29)
Calcium: 9.7 mg/dL (ref 8.7–10.2)
Chloride: 103 mmol/L (ref 96–106)
Creatinine, Ser: 0.77 mg/dL (ref 0.57–1.00)
Globulin, Total: 2.8 g/dL (ref 1.5–4.5)
Glucose: 95 mg/dL (ref 70–99)
Potassium: 4.3 mmol/L (ref 3.5–5.2)
Sodium: 139 mmol/L (ref 134–144)
Total Protein: 7.5 g/dL (ref 6.0–8.5)
eGFR: 108 mL/min/{1.73_m2} (ref >59)

## 2022-11-28 LAB — CBC
Hematocrit: 38.1 % (ref 34.0–46.6)
Hemoglobin: 12.1 g/dL (ref 11.1–15.9)
MCH: 28.3 pg (ref 26.6–33.0)
MCHC: 31.8 g/dL (ref 31.5–35.7)
MCV: 89 fL (ref 79–97)
Platelets: 221 10*3/uL (ref 150–450)
RBC: 4.28 x10E6/uL (ref 3.77–5.28)
RDW: 12.1 % (ref 11.7–15.4)
WBC: 6.7 10*3/uL (ref 3.4–10.8)

## 2022-11-28 LAB — HEMOGLOBIN A1C
Est. average glucose Bld gHb Est-mCnc: 103 mg/dL
Hgb A1c MFr Bld: 5.2 % (ref 4.8–5.6)

## 2022-11-28 LAB — TSH: TSH: 1.62 u[IU]/mL (ref 0.450–4.500)

## 2022-12-11 ENCOUNTER — Ambulatory Visit: Payer: Managed Care, Other (non HMO) | Admitting: Medical-Surgical

## 2023-01-05 ENCOUNTER — Encounter: Payer: Self-pay | Admitting: *Deleted

## 2023-05-25 ENCOUNTER — Ambulatory Visit: Payer: Managed Care, Other (non HMO) | Admitting: Medical-Surgical

## 2023-05-25 ENCOUNTER — Encounter: Payer: Self-pay | Admitting: Medical-Surgical

## 2023-05-25 VITALS — BP 115/78 | HR 92 | Resp 20 | Ht 65.0 in | Wt 240.6 lb

## 2023-05-25 DIAGNOSIS — R110 Nausea: Secondary | ICD-10-CM

## 2023-05-25 DIAGNOSIS — R1032 Left lower quadrant pain: Secondary | ICD-10-CM | POA: Diagnosis not present

## 2023-05-25 DIAGNOSIS — F988 Other specified behavioral and emotional disorders with onset usually occurring in childhood and adolescence: Secondary | ICD-10-CM | POA: Insufficient documentation

## 2023-05-25 LAB — POCT URINE PREGNANCY: Preg Test, Ur: NEGATIVE

## 2023-05-25 MED ORDER — ONDANSETRON 8 MG PO TBDP
8.0000 mg | ORAL_TABLET | Freq: Three times a day (TID) | ORAL | 3 refills | Status: DC | PRN
Start: 2023-05-25 — End: 2023-11-23

## 2023-05-25 NOTE — Progress Notes (Signed)
        Established patient visit  History, exam, impression, and plan:  1. Nausea 2. Left lower quadrant abdominal pain Pleasant 27 year old female presenting today with reports of ongoing issues with nausea and vomiting.  Reports that on Mother's Day, she was at a restaurant when she had nausea that resulted in vomiting 3 times.  Her symptoms persisted for approximately 3 weeks before spontaneously resolving.  Also notes that she had vomiting and nausea yesterday but correlated this with a migraine.  She has been on semaglutide prescribed through an online weight loss clinic and tolerating it well overall.  She did stop when her initial nausea and vomiting started but once it resolved, she restarted back.  She has been on this since mid June and doing well.  She is currently on oral contraceptives and does not have any menstrual bleeding.  When her nausea originally started, she worried that she may have been pregnant.  She took several urgency test at home, all with negative results.  Has been having some stomach aches and cramping but no fevers, chills, or diarrhea.  Having issues with constipation and is using MiraLAX daily as needed.  Notes that her pain is most often in the left lower quadrant and denies right upper quadrant discomfort.  Her nausea and vomiting symptoms are worse with greasy fatty foods.  On exam, abdomen soft, nondistended.  Tenderness noted in the left upper and left lower quadrant on palpation.  Hypoactive bowel sounds x 4 quadrants.  No HSM.  HRR, S1/S2 normal.  Lungs CTA.  Respirations even and unlabored.  No peripheral edema.  Unclear etiology.  Consider constipation, diverticulitis, medication side effect, cholelithiasis, etc.  POC UPT negative today.  Plan to check labs as below.  CT abdomen pelvis stat to rule out diverticulitis.  Sending in Zofran 3 times daily as needed nausea/vomiting. - CBC with Differential/Platelet - COMPLETE METABOLIC PANEL WITH GFR - Lipase -  Amylase - DG Abd 1 View; Future - ondansetron (ZOFRAN-ODT) 8 MG disintegrating tablet; Take 1 tablet (8 mg total) by mouth every 8 (eight) hours as needed for nausea.  Dispense: 20 tablet; Refill: 3 - CT ABDOMEN PELVIS WO CONTRAST; Future  Procedures performed this visit: None.  Return if symptoms worsen or fail to improve.  __________________________________ Thayer Ohm, DNP, APRN, FNP-BC Primary Care and Sports Medicine [facility]

## 2023-05-26 LAB — CBC WITH DIFFERENTIAL/PLATELET
Absolute Monocytes: 549 cells/uL (ref 200–950)
Basophils Absolute: 27 cells/uL (ref 0–200)
Basophils Relative: 0.3 %
Eosinophils Absolute: 126 cells/uL (ref 15–500)
Eosinophils Relative: 1.4 %
HCT: 38.2 % (ref 35.0–45.0)
Hemoglobin: 12.4 g/dL (ref 11.7–15.5)
Lymphs Abs: 2223 cells/uL (ref 850–3900)
MCH: 28.4 pg (ref 27.0–33.0)
MCHC: 32.5 g/dL (ref 32.0–36.0)
MCV: 87.4 fL (ref 80.0–100.0)
MPV: 12 fL (ref 7.5–12.5)
Monocytes Relative: 6.1 %
Neutro Abs: 6075 cells/uL (ref 1500–7800)
Neutrophils Relative %: 67.5 %
Platelets: 235 10*3/uL (ref 140–400)
RBC: 4.37 10*6/uL (ref 3.80–5.10)
RDW: 11.9 % (ref 11.0–15.0)
Total Lymphocyte: 24.7 %
WBC: 9 10*3/uL (ref 3.8–10.8)

## 2023-05-26 LAB — COMPLETE METABOLIC PANEL WITH GFR
AG Ratio: 1.6 (calc) (ref 1.0–2.5)
ALT: 25 U/L (ref 6–29)
AST: 16 U/L (ref 10–30)
Albumin: 4.9 g/dL (ref 3.6–5.1)
Alkaline phosphatase (APISO): 64 U/L (ref 31–125)
BUN: 15 mg/dL (ref 7–25)
CO2: 27 mmol/L (ref 20–32)
Calcium: 10.2 mg/dL (ref 8.6–10.2)
Chloride: 103 mmol/L (ref 98–110)
Creat: 0.83 mg/dL (ref 0.50–0.96)
Globulin: 3.1 g/dL (calc) (ref 1.9–3.7)
Glucose, Bld: 84 mg/dL (ref 65–99)
Potassium: 4.1 mmol/L (ref 3.5–5.3)
Sodium: 137 mmol/L (ref 135–146)
Total Bilirubin: 0.3 mg/dL (ref 0.2–1.2)
Total Protein: 8 g/dL (ref 6.1–8.1)
eGFR: 98 mL/min/{1.73_m2} (ref >=60)

## 2023-05-26 LAB — LIPASE: Lipase: 31 U/L (ref 7–60)

## 2023-05-26 LAB — AMYLASE: Amylase: 42 U/L (ref 21–101)

## 2023-05-26 NOTE — Progress Notes (Signed)
My apologies.  My dictation device was giving me if it and I missed that on proofreading.  It was pending completion rather than ablation.  Sorry for the confusion.

## 2023-05-28 ENCOUNTER — Ambulatory Visit (INDEPENDENT_AMBULATORY_CARE_PROVIDER_SITE_OTHER): Payer: Managed Care, Other (non HMO)

## 2023-05-28 DIAGNOSIS — R110 Nausea: Secondary | ICD-10-CM

## 2023-05-28 DIAGNOSIS — R1032 Left lower quadrant pain: Secondary | ICD-10-CM | POA: Diagnosis not present

## 2023-05-29 ENCOUNTER — Encounter: Payer: Self-pay | Admitting: Medical-Surgical

## 2023-05-29 DIAGNOSIS — R110 Nausea: Secondary | ICD-10-CM

## 2023-05-29 DIAGNOSIS — R1032 Left lower quadrant pain: Secondary | ICD-10-CM

## 2023-11-23 ENCOUNTER — Encounter: Payer: Self-pay | Admitting: Obstetrics and Gynecology

## 2023-11-23 ENCOUNTER — Ambulatory Visit (INDEPENDENT_AMBULATORY_CARE_PROVIDER_SITE_OTHER): Payer: BC Managed Care – PPO | Admitting: Obstetrics and Gynecology

## 2023-11-23 ENCOUNTER — Other Ambulatory Visit (HOSPITAL_COMMUNITY)
Admission: RE | Admit: 2023-11-23 | Discharge: 2023-11-23 | Disposition: A | Discharge status: Home / Self Care | Payer: BC Managed Care – PPO | Source: Ambulatory Visit | Attending: Obstetrics and Gynecology | Admitting: Obstetrics and Gynecology

## 2023-11-23 VITALS — BP 133/79 | HR 80 | Ht 65.0 in | Wt 240.0 lb

## 2023-11-23 DIAGNOSIS — Z01419 Encounter for gynecological examination (general) (routine) without abnormal findings: Secondary | ICD-10-CM

## 2023-11-23 NOTE — Progress Notes (Signed)
Last pap 10/2021 neg

## 2023-11-23 NOTE — Progress Notes (Signed)
 GYNECOLOGY ANNUAL PREVENTATIVE CARE ENCOUNTER NOTE  History:     Kara Cole is a 28 y.o. G39P1011 female here for a routine annual gynecologic exam.  Current complaints: None.   Denies abnormal vaginal bleeding, discharge, pelvic pain, problems with intercourse or other gynecologic concerns.   Hoping to conceive soon. Working on weight loss, has lost 15+ lbs. Was previously on semaglutide , but stopped due to insurance. Hoping to restart this. Recently started having menstrual cycles every 45 days since weight loss.  Taking prenatal vitamins daily  Maternal Aunt with ovarian CA. Maternal grandmother with abnormal pap Patient requests yearly PAP smear.   Gynecologic History Patient's last menstrual period was 11/14/2023. Contraception: none Last Pap: 10/28/2021. Result was normal with negative HPV Last Mammogram: NA  Obstetric History OB History  Gravida Para Term Preterm AB Living  2 1 1  1 1   SAB IAB Ectopic Multiple Live Births  1   0 1    # Outcome Date GA Lbr Len/2nd Weight Sex Type Anes PTL Lv  2 Term 09/14/18 [redacted]w[redacted]d 35:16 / 02:05 7 lb 4 oz (3.289 kg) M Vag-Spont EPI  LIV  1 SAB  [redacted]w[redacted]d           Past Medical History:  Diagnosis Date   ADHD (attention deficit hyperactivity disorder)    Bilateral ovarian cysts    Headache     History reviewed. No pertinent surgical history.  Current Outpatient Medications on File Prior to Visit  Medication Sig Dispense Refill   Prenatal Vit-Fe Fumarate-FA (MULTIVITAMIN-PRENATAL) 27-0.8 MG TABS tablet Take 1 tablet by mouth daily at 12 noon.     No current facility-administered medications on file prior to visit.    No Known Allergies  Social History:  reports that she has never smoked. She has never used smokeless tobacco. She reports current alcohol use of about 2.0 - 3.0 standard drinks of alcohol per week. She reports that she does not use drugs.  Family History  Problem Relation Age of Onset   Hypertension  Mother    Thyroid disease Mother    Depression Mother    Hyperlipidemia Father    ADD / ADHD Father    Hypertension Father    Thyroid disease Father    Anxiety disorder Sister    ADD / ADHD Sister    ADD / ADHD Brother    Depression Brother    Stroke Maternal Grandfather    Cancer Paternal Grandmother        brain   Stroke Paternal Grandmother     The following portions of the patient's history were reviewed and updated as appropriate: allergies, current medications, past family history, past medical history, past social history, past surgical history and problem list.  Review of Systems Pertinent items noted in HPI and remainder of comprehensive ROS otherwise negative.  Physical Exam:  BP 133/79   Pulse 80   Ht 5' 5 (1.651 m)   Wt 240 lb (108.9 kg)   LMP 11/14/2023   BMI 39.94 kg/m  CONSTITUTIONAL: Well-developed, well-nourished female in no acute distress.  HENT:  Normocephalic, atraumatic, External right and left ear normal.  EYES: Conjunctivae and EOM are normal. Pupils are equal, round, and reactive to light. No scleral icterus.  NECK: Normal range of motion, supple, no masses.  Normal thyroid.  SKIN: Skin is warm and dry. No rash noted. Not diaphoretic. No erythema. No pallor. MUSCULOSKELETAL: Normal range of motion. No tenderness.  No cyanosis, clubbing, or edema.  NEUROLOGIC: Alert and oriented to person, place, and time. Normal reflexes, muscle tone coordination.  PSYCHIATRIC: Normal mood and affect. Normal behavior. Normal judgment and thought content. CARDIOVASCULAR: Normal heart rate noted, regular rhythm RESPIRATORY: Clear to auscultation bilaterally. Effort and breath sounds normal, no problems with respiration noted. BREASTS: Symmetric in size. No masses, tenderness, skin changes, nipple drainage, or lymphadenopathy bilaterally. Performed in the presence of a chaperone. ABDOMEN: Soft, no distention noted.  No tenderness, rebound or guarding.  PELVIC: Normal  appearing external genitalia and urethral meatus; normal appearing vaginal mucosa and cervix.  No abnormal vaginal discharge noted.  Pap smear obtained.  Normal uterine size, no other palpable masses, no uterine or adnexal tenderness.  Performed in the presence of a chaperone.   Assessment and Plan:    1. Well woman exam (Primary)  - Cytology - PAP( [place])  - follow up with PCP for weight loss for options.  - Continue PV   Will follow up results of pap smear and manage accordingly. Normal breast examination today, she was advised to perform periodic self breast examinations.  Routine preventative health maintenance measures emphasized. Please refer to After Visit Summary for other counseling recommendations.   Prynce Jacober, Delon FERNS, NP [facility] for [employer], [facility]

## 2023-11-29 LAB — CYTOLOGY - PAP: Diagnosis: NEGATIVE

## 2023-12-20 ENCOUNTER — Other Ambulatory Visit: Payer: Self-pay

## 2023-12-20 ENCOUNTER — Ambulatory Visit
Admission: EM | Admit: 2023-12-20 | Discharge: 2023-12-20 | Disposition: A | Discharge status: Home / Self Care | Payer: BC Managed Care – PPO | Attending: Family Medicine | Admitting: Family Medicine

## 2023-12-20 DIAGNOSIS — J00 Acute nasopharyngitis [common cold]: Secondary | ICD-10-CM

## 2023-12-20 MED ORDER — AZITHROMYCIN 250 MG PO TABS: 250.0000 mg | ORAL_TABLET | Freq: Every day | ORAL | 0 refills | Status: DC

## 2023-12-20 MED ORDER — PREDNISONE 20 MG PO TABS: 40.0000 mg | ORAL_TABLET | Freq: Every day | ORAL | 0 refills | Status: DC

## 2023-12-20 MED ORDER — BENZONATATE 200 MG PO CAPS: 200.0000 mg | ORAL_CAPSULE | Freq: Three times a day (TID) | ORAL | 0 refills | Status: DC | PRN

## 2023-12-20 NOTE — ED Triage Notes (Signed)
 Congestion x 1 week. Cough started yesterday, which has been worsening. Son also sick, was tested for flu/covid and strep which all were negative. No fever that she has noticed. Has taken cold/flu medicine, last taken this morning at 1100.

## 2023-12-20 NOTE — Discharge Instructions (Signed)
 Drink lots of water Take the antibiotic as directed 2 now then one a day until gone Take prednisone  daily for 5 days Take tessalon  for the cough Use afrin before you fly

## 2023-12-21 NOTE — ED Provider Notes (Signed)
[facility]    CSN: [id number] Arrival date & time: 12/20/23  1225      History   Chief Complaint Chief Complaint  Patient presents with   Cough    HPI Kara Cole is a 28 y.o. female.   HPI  Patient states she has had a cough and cold.  Feels tired.  Feels congested.  She states she took her son to the doctor for illness.  He was tested for flu COVID and strep and was negative for all the above.  Patient states that this has been going on for about a week.  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 No pertinent surgical history.  OB History     Gravida  2   Para  1   Term  1   Preterm      AB  1   Living  1      SAB  1   IAB      Ectopic      Multiple  0   Live Births  1            Home Medications    Prior to Admission medications   Medication Sig Start Date End Date Taking? Authorizing Provider  azithromycin (ZITHROMAX) 250 MG tablet Take 1 tablet (250 mg total) by mouth daily. Take first 2 tablets together, then 1 every day until finished. 12/20/23  Yes Eustace Moore, MD  benzonatate (TESSALON) 200 MG capsule Take 1 capsule (200 mg total) by mouth 3 (three) times daily as needed for cough. 12/20/23  Yes Eustace Moore, MD  predniSONE (DELTASONE) 20 MG tablet Take 2 tablets (40 mg total) by mouth daily with breakfast. 12/20/23  Yes Eustace Moore, MD    Family History Family History  Problem Relation Age of Onset   Hypertension Mother    Thyroid disease Mother     Depression Mother    Hyperlipidemia Father    ADD / ADHD Father    Hypertension Father    Thyroid disease Father    Anxiety disorder Sister    ADD / ADHD Sister    ADD / ADHD Brother    Depression Brother    Stroke Maternal Grandfather    Cancer Paternal Grandmother        brain   Stroke Paternal Grandmother     Social History Social History   Tobacco Use   Smoking status: Never   Smokeless tobacco: Never  Vaping Use   Vaping status: Never Used  Substance Use Topics   Alcohol use: Yes    Alcohol/week: 2.0 - 3.0 standard drinks of alcohol    Types: 2 - 3 Standard drinks or equivalent per week   Drug use: Never     Allergies   Patient has no known allergies.   Review of Systems Review of Systems See HPI  Physical Exam Triage Vital Signs ED Triage Vitals  Encounter Vitals Group     BP 12/20/23 1324 112/74     Systolic BP Percentile --  Diastolic BP Percentile --      Pulse Rate 12/20/23 1324 93     Resp 12/20/23 1324 16     Temp 12/20/23 1324 98.8 F (37.1 C)     Temp Source 12/20/23 1324 Oral     SpO2 12/20/23 1324 97 %     Weight --      Height --      Head Circumference --      Peak Flow --      Pain Score 12/20/23 1327 5     Pain Loc --      Pain Education --      Exclude from Growth Chart --    No data found.  Updated Vital Signs BP 112/74   Pulse 93   Temp 98.8 F (37.1 C) (Oral)   Resp 16   LMP 12/20/2023 (Exact Date)   SpO2 97%       Physical Exam Constitutional:      General: She is not in acute distress.    Appearance: She is well-developed. She is ill-appearing.  HENT:     Head: Normocephalic and atraumatic.     Right Ear: Tympanic membrane normal.     Left Ear: Tympanic membrane normal.     Nose: No congestion or rhinorrhea.     Mouth/Throat:     Pharynx: No posterior oropharyngeal erythema.  Eyes:     Conjunctiva/sclera: Conjunctivae normal.     Pupils: Pupils are equal, round, and reactive to light.   Cardiovascular:     Rate and Rhythm: Normal rate.  Pulmonary:     Effort: Pulmonary effort is normal. No respiratory distress.  Abdominal:     General: There is no distension.     Palpations: Abdomen is soft.  Musculoskeletal:        General: Normal range of motion.     Cervical back: Normal range of motion.  Skin:    General: Skin is warm and dry.  Neurological:     Mental Status: She is alert.      UC Treatments / Results  Labs (all labs ordered are listed, but only abnormal results are displayed) Labs Reviewed - No data to display  EKG   Radiology No results found.  Procedures Procedures (including critical care time)  Medications Ordered in UC Medications - No data to display  Initial Impression / Assessment and Plan / UC Course  I have reviewed the triage vital signs and the nursing notes.  Pertinent labs & imaging results that were available during my care of the patient were reviewed by me and considered in my medical decision making (see chart for details).    Patient has upper respiratory symptoms that been lasting longer than a week.  She feels that they are worsening.  Given will give a trial of antibiotics Final Clinical Impressions(s) / UC Diagnoses   Final diagnoses:  Acute nasopharyngitis     Discharge Instructions      Drink lots of water Take the antibiotic as directed 2 now then one a day until gone Take prednisone daily for 5 days Take tessalon for the cough Use afrin before you fly   ED Prescriptions     Medication Sig Dispense Auth. Provider   azithromycin (ZITHROMAX) 250 MG tablet Take 1 tablet (250 mg total) by mouth daily. Take first 2 tablets together, then 1 every day until finished. 6 tablet Eustace Moore, MD   predniSONE (DELTASONE) 20 MG tablet Take 2 tablets (40 mg  total) by mouth daily with breakfast. 10 tablet Eustace Moore, MD   benzonatate (TESSALON) 200 MG capsule Take 1 capsule (200 mg total) by mouth 3  (three) times daily as needed for cough. 21 capsule Eustace Moore, MD      PDMP not reviewed this encounter.   Eustace Moore, MD 12/21/23 1235

## 2024-05-04 ENCOUNTER — Encounter: Payer: Self-pay | Admitting: Medical-Surgical

## 2024-05-04 ENCOUNTER — Ambulatory Visit: Admitting: Medical-Surgical

## 2024-05-04 VITALS — BP 124/76 | HR 87 | Resp 20 | Ht 65.0 in | Wt 251.1 lb

## 2024-05-04 DIAGNOSIS — R519 Headache, unspecified: Secondary | ICD-10-CM | POA: Diagnosis not present

## 2024-05-04 MED ORDER — ONDANSETRON 4 MG PO TBDP
8.0000 mg | ORAL_TABLET | Freq: Once | ORAL | Status: AC
  Administered 2024-05-04: 8 mg via ORAL

## 2024-05-04 MED ORDER — ONDANSETRON 4 MG PO TBDP: 4.0000 mg | ORAL_TABLET | Freq: Three times a day (TID) | ORAL | 0 refills | Status: DC | PRN

## 2024-05-04 MED ORDER — DEXAMETHASONE SODIUM PHOSPHATE 10 MG/ML IJ SOLN
10.0000 mg | Freq: Once | INTRAMUSCULAR | Status: AC
  Administered 2024-05-04: 10 mg via INTRAMUSCULAR

## 2024-05-04 MED ORDER — ONDANSETRON 4 MG PO TBDP: 4.0000 mg | ORAL_TABLET | Freq: Once | ORAL | Status: AC

## 2024-05-04 MED ORDER — RIZATRIPTAN BENZOATE 10 MG PO TBDP: 10.0000 mg | ORAL_TABLET | ORAL | 3 refills | Status: AC | PRN

## 2024-05-04 MED ORDER — KETOROLAC TROMETHAMINE 60 MG/2ML IM SOLN
60.0000 mg | Freq: Once | INTRAMUSCULAR | Status: AC
  Administered 2024-05-04: 60 mg via INTRAMUSCULAR

## 2024-05-04 NOTE — Progress Notes (Signed)
        Established patient visit  History, exam, impression, and plan:  Headache  This is a recurrent problem. The current episode started 1 to 4 weeks ago. The problem occurs constantly. The problem has been gradually worsening. The pain is located in the Bilateral and temporal region. The pain quality is similar to prior headaches. The quality of the pain is described as aching, band-like and sharp. The pain is at a severity of 5/10. The pain is moderate. Associated symptoms include nausea and photophobia. Pertinent negatives include no coughing, dizziness, fever, insomnia, phonophobia or vomiting. She has tried NSAIDs for the symptoms. The treatment provided mild relief. Her past medical history is significant for migraine headaches.    Review of Systems  Constitutional:  Negative for chills, fever and malaise/fatigue.  Eyes:  Positive for photophobia.  Respiratory:  Negative for cough, shortness of breath and wheezing.   Cardiovascular:  Negative for chest pain, palpitations and leg swelling.  Gastrointestinal:  Positive for nausea. Negative for vomiting.  Neurological:  Positive for headaches. Negative for dizziness.  Psychiatric/Behavioral:  Negative for depression and suicidal ideas. The patient is not nervous/anxious and does not have insomnia.     Physical Exam Vitals reviewed.  Constitutional:      General: She is not in acute distress.    Appearance: Normal appearance. She is well-developed. She is ill-appearing.  HENT:     Head: Normocephalic and atraumatic.   Cardiovascular:     Rate and Rhythm: Normal rate and regular rhythm.     Pulses: Normal pulses.  Pulmonary:     Effort: Pulmonary effort is normal. No respiratory distress.     Breath sounds: Normal breath sounds.   Skin:    General: Skin is warm and dry.   Neurological:     Mental Status: She is alert and oriented to person, place, and time.   Psychiatric:        Mood and Affect: Mood normal.         Behavior: Behavior normal.        Thought Content: Thought content normal.        Judgment: Judgment normal.    1. Worsening headaches (Primary) With multiple family members diagnosed with brain lesions and her worsening/persistent headaches, plan for MRI brain w/o contrast. Toradol  and Decadron  given in office. Zofran  sent to her pharmacy for prn use. One tablet of Zofran  provided in office for her to take once she gets home. Adding Maxalt  to trial on an as needed basis. Ok to use OTC pain relievers but minimize to no more than 3 times weekly to prevent rebound headaches.  - rizatriptan  (MAXALT -MLT) 10 MG disintegrating tablet; Take 1 tablet (10 mg total) by mouth as needed for migraine. May repeat in 2 hours if needed  Dispense: 10 tablet; Refill: 3 - MR Brain Wo Contrast; Future - ketorolac  (TORADOL ) injection 60 mg - dexamethasone  (DECADRON ) injection 10 mg - ondansetron  (ZOFRAN -ODT) disintegrating tablet 8 mg  Procedures performed this visit: None.  Return if symptoms worsen or fail to improve.  __________________________________ Zada FREDRIK Palin, DNP, APRN, FNP-BC Primary Care and Sports Medicine [facility]

## 2024-05-05 ENCOUNTER — Encounter: Payer: Self-pay | Admitting: Medical-Surgical

## 2024-05-07 ENCOUNTER — Ambulatory Visit (INDEPENDENT_AMBULATORY_CARE_PROVIDER_SITE_OTHER)

## 2024-05-07 DIAGNOSIS — R519 Headache, unspecified: Secondary | ICD-10-CM | POA: Diagnosis not present

## 2024-05-07 DIAGNOSIS — Z808 Family history of malignant neoplasm of other organs or systems: Secondary | ICD-10-CM | POA: Diagnosis not present

## 2024-05-09 ENCOUNTER — Ambulatory Visit: Payer: Self-pay | Admitting: Medical-Surgical

## 2024-05-09 MED ORDER — TOPIRAMATE 50 MG PO TABS: ORAL_TABLET | ORAL | 1 refills | Status: AC

## 2024-11-22 ENCOUNTER — Inpatient Hospital Stay (HOSPITAL_COMMUNITY): Admit: 2024-11-22 | Payer: BC Managed Care – PPO
